# Patient Record
Sex: Female | Born: 1955
Health system: Southern US, Community
[De-identification: ages and names within clinical notes are randomized; demographics above are authoritative.]

## PROBLEM LIST (undated history)

## (undated) DIAGNOSIS — M1990 Unspecified osteoarthritis, unspecified site: Secondary | ICD-10-CM

## (undated) DIAGNOSIS — C801 Malignant (primary) neoplasm, unspecified: Secondary | ICD-10-CM

## (undated) DIAGNOSIS — I10 Essential (primary) hypertension: Secondary | ICD-10-CM

## (undated) HISTORY — PX: ABDOMINAL HYSTERECTOMY: SHX81

---

## 2011-11-02 ENCOUNTER — Encounter (HOSPITAL_BASED_OUTPATIENT_CLINIC_OR_DEPARTMENT_OTHER): Payer: Self-pay | Admitting: Emergency Medicine

## 2011-11-02 ENCOUNTER — Emergency Department (HOSPITAL_BASED_OUTPATIENT_CLINIC_OR_DEPARTMENT_OTHER)
Admission: EM | Admit: 2011-11-02 | Discharge: 2011-11-02 | Disposition: A | Discharge status: Home / Self Care | Payer: Self-pay | Attending: Emergency Medicine | Admitting: Emergency Medicine

## 2011-11-02 ENCOUNTER — Emergency Department (HOSPITAL_BASED_OUTPATIENT_CLINIC_OR_DEPARTMENT_OTHER): Payer: Self-pay

## 2011-11-02 DIAGNOSIS — J40 Bronchitis, not specified as acute or chronic: Secondary | ICD-10-CM | POA: Insufficient documentation

## 2011-11-02 DIAGNOSIS — Z8739 Personal history of other diseases of the musculoskeletal system and connective tissue: Secondary | ICD-10-CM | POA: Insufficient documentation

## 2011-11-02 DIAGNOSIS — F17200 Nicotine dependence, unspecified, uncomplicated: Secondary | ICD-10-CM | POA: Insufficient documentation

## 2011-11-02 HISTORY — DX: Unspecified osteoarthritis, unspecified site: M19.90

## 2011-11-02 MED ORDER — ALBUTEROL SULFATE (5 MG/ML) 0.5% IN NEBU
5.0000 mg | INHALATION_SOLUTION | Freq: Once | RESPIRATORY_TRACT | Status: AC
  Administered 2011-11-02: 5 mg via RESPIRATORY_TRACT
  Filled 2011-11-02: qty 1

## 2011-11-02 MED ORDER — ALBUTEROL SULFATE HFA 108 (90 BASE) MCG/ACT IN AERS
2.0000 | INHALATION_SPRAY | RESPIRATORY_TRACT | Status: DC | PRN
  Administered 2011-11-02 (×2): 2 via RESPIRATORY_TRACT
  Filled 2011-11-02: qty 6.7

## 2011-11-02 MED ORDER — IPRATROPIUM BROMIDE 0.02 % IN SOLN
0.5000 mg | Freq: Once | RESPIRATORY_TRACT | Status: AC
  Administered 2011-11-02: 0.5 mg via RESPIRATORY_TRACT
  Filled 2011-11-02: qty 2.5

## 2011-11-02 MED ORDER — FLUTICASONE PROPIONATE HFA 44 MCG/ACT IN AERO
2.0000 | INHALATION_SPRAY | Freq: Two times a day (BID) | RESPIRATORY_TRACT | Status: DC
  Administered 2011-11-02: 2 via RESPIRATORY_TRACT
  Filled 2011-11-02: qty 10.6

## 2011-11-02 NOTE — ED Notes (Signed)
Pt reports waking up Friday night with sob, reports taking aleve cold and congestion for cough, reports feeling tightness in  Chest since Friday night, pressure mid sternum

## 2011-11-02 NOTE — ED Provider Notes (Addendum)
History  This chart was scribed for Hanley Seamen, MD by Bennett Scrape. This patient was seen in room MH09/MH09 and the patient's care was started at 12:26AM.  CSN: [id number]  Arrival date & time 11/02/11  0001   First MD Initiated Contact with Patient 11/02/11 0026      Chief Complaint  Patient presents with  . Shortness of Breath     The history is provided by the patient. No language interpreter was used.    Rachael Lester is a 56 y.o. female who presents to the Emergency Department complaining of 4 days of gradual onset, gradually worsening, constant SOB with associated non-productive cough, wheezing, mild abdominal pain described as soreness, mild mid back pain described as soreness and mid sternum CP described as pressure and tightness. The pains are worse with coughing and the cough is worse with deep breathing. She reports taking Aleve cold and congestion for cough with no improvement. She reports prior episode several years ago diagnosed as PNA. She denies fever, sore throat, visual disturbance, nausea, emesis, diarrhea, urinary symptoms, HA, and rash as associated symptoms. She has a h/io arthritis and is a current everyday smoker and occasional alcohol user.   Past Medical History  Diagnosis Date  . Arthritis     History reviewed. No pertinent past surgical history.  History reviewed. No pertinent family history.  History  Substance Use Topics  . Smoking status: Current Everyday Smoker -- 0.5 packs/day  . Smokeless tobacco: Not on file  . Alcohol Use: Yes     occasional    No OB history provided.  Review of Systems  A complete 10 system review of systems was obtained and all systems are negative except as noted in the HPI and PMH.   Allergies  Review of patient's allergies indicates no known allergies.  Home Medications  No current outpatient prescriptions on file.  Triage Vitals: BP 181/104  Pulse 92  Temp 97.3 F (36.3 C)  Resp 20  Ht 5\' 6"   (1.676 m)  Wt 181 lb (82.101 kg)  BMI 29.21 kg/m2  SpO2 93%  Physical Exam  Nursing note and vitals reviewed. Constitutional: She is oriented to person, place, and time. She appears well-developed and well-nourished. No distress.  HENT:  Head: Normocephalic and atraumatic.  Eyes: Conjunctivae and EOM are normal.  Neck: Neck supple. No tracheal deviation present.  Cardiovascular: Normal rate and regular rhythm.   No murmur heard. Pulmonary/Chest: Effort normal. No respiratory distress. She has no wheezes. She has no rales. She exhibits no tenderness.       Mildly decreased air movement  Abdominal: Soft. Bowel sounds are normal. There is tenderness (mild diffuse tenderness of the abdomen). There is no rebound and no guarding.  Musculoskeletal: Normal range of motion. She exhibits no tenderness.       No spine tenderness  Neurological: She is alert and oriented to person, place, and time.  Skin: Skin is warm and dry.  Psychiatric: She has a normal mood and affect. Her behavior is normal.    ED Course  Procedures (including critical care time)  DIAGNOSTIC STUDIES: Oxygen Saturation is 93% on room air, adequate by my interpretation.    COORDINATION OF CARE: 12:48AM-Informed pt that her EKG was normal. Discussed treatment plan which includes a breathing treatment and a CXR with pt at bedside and pt agreed to plan.    MDM  Nursing notes and vitals signs, including pulse oximetry, reviewed.  Summary of this visit's results, reviewed  by myself:  Imaging Studies: Dg Chest 2 View  11/02/2011  *RADIOLOGY REPORT*  Clinical Data: Short of breath  CHEST - 2 VIEW  Comparison: None.  Findings: Heart size upper normal.  Negative for heart failure. Negative for pneumonia or effusion.  Negative for mass lesion.  IMPRESSION: No acute abnormality.   Original Report Authenticated By: Camelia Phenes, M.D.    1:42 AM Patient feels better, air movement improved after albuterol and Atrovent neb  treatment. Patient advised that this may represent the early stages of COPD and she was encouraged to quit smoking.   Date: 11/02/2011 12:24 AM  Rate: 87  Rhythm: normal sinus rhythm  QRS Axis: normal  Intervals: normal  ST/T Wave abnormalities: normal  Conduction Disutrbances: none  Narrative Interpretation: unremarkable  Comparison with previous EKG: none available          I personally performed the services described in this documentation, which was scribed in my presence.  The recorded information has been reviewed and considered.    Hanley Seamen, MD 11/02/11 0143  Hanley Seamen, MD 11/02/11 9811

## 2011-11-02 NOTE — ED Notes (Signed)
Pt has a hx of smoking but has high BP and states that she has no respiratory hx. Pt states that she smokes everyday and presented to the ED with a cold.

## 2016-09-20 ENCOUNTER — Ambulatory Visit: Payer: Medicaid Other

## 2016-09-20 ENCOUNTER — Ambulatory Visit (HOSPITAL_BASED_OUTPATIENT_CLINIC_OR_DEPARTMENT_OTHER): Payer: Medicaid Other | Admitting: Hematology & Oncology

## 2016-09-20 ENCOUNTER — Ambulatory Visit: Payer: PRIVATE HEALTH INSURANCE

## 2016-09-20 ENCOUNTER — Other Ambulatory Visit (HOSPITAL_BASED_OUTPATIENT_CLINIC_OR_DEPARTMENT_OTHER): Payer: Medicaid Other

## 2016-09-20 VITALS — BP 135/92 | HR 101 | Temp 98.4°F | Resp 16 | Ht 66.0 in | Wt 145.0 lb

## 2016-09-20 DIAGNOSIS — C342 Malignant neoplasm of middle lobe, bronchus or lung: Secondary | ICD-10-CM

## 2016-09-20 DIAGNOSIS — F17218 Nicotine dependence, cigarettes, with other nicotine-induced disorders: Secondary | ICD-10-CM

## 2016-09-20 DIAGNOSIS — Z1231 Encounter for screening mammogram for malignant neoplasm of breast: Secondary | ICD-10-CM

## 2016-09-20 DIAGNOSIS — Z923 Personal history of irradiation: Secondary | ICD-10-CM

## 2016-09-20 DIAGNOSIS — F32A Depression, unspecified: Secondary | ICD-10-CM

## 2016-09-20 DIAGNOSIS — F329 Major depressive disorder, single episode, unspecified: Secondary | ICD-10-CM

## 2016-09-20 DIAGNOSIS — C3431 Malignant neoplasm of lower lobe, right bronchus or lung: Secondary | ICD-10-CM

## 2016-09-20 DIAGNOSIS — I10 Essential (primary) hypertension: Secondary | ICD-10-CM

## 2016-09-20 DIAGNOSIS — F064 Anxiety disorder due to known physiological condition: Secondary | ICD-10-CM

## 2016-09-20 DIAGNOSIS — Z9221 Personal history of antineoplastic chemotherapy: Secondary | ICD-10-CM

## 2016-09-20 LAB — CBC WITH DIFFERENTIAL (CANCER CENTER ONLY)
BASO#: 0 10*3/uL (ref 0.0–0.2)
BASO%: 0.5 % (ref 0.0–2.0)
EOS%: 2.2 % (ref 0.0–7.0)
Eosinophils Absolute: 0.1 10*3/uL (ref 0.0–0.5)
HCT: 35 % (ref 34.8–46.6)
HGB: 11.8 g/dL (ref 11.6–15.9)
LYMPH#: 1 10*3/uL (ref 0.9–3.3)
LYMPH%: 23.4 % (ref 14.0–48.0)
MCH: 29.5 pg (ref 26.0–34.0)
MCHC: 33.7 g/dL (ref 32.0–36.0)
MCV: 88 fL (ref 81–101)
MONO#: 0.5 10*3/uL (ref 0.1–0.9)
MONO%: 11.1 % (ref 0.0–13.0)
NEUT#: 2.6 10*3/uL (ref 1.5–6.5)
NEUT%: 62.8 % (ref 39.6–80.0)
PLATELETS: 169 10*3/uL (ref 145–400)
RBC: 4 10*6/uL (ref 3.70–5.32)
RDW: 13.7 % (ref 11.1–15.7)
WBC: 4.1 10*3/uL (ref 3.9–10.0)

## 2016-09-20 LAB — CMP (CANCER CENTER ONLY)
ALT: 15 U/L (ref 10–47)
AST: 22 U/L (ref 11–38)
Albumin: 3.6 g/dL (ref 3.3–5.5)
Alkaline Phosphatase: 65 U/L (ref 26–84)
BUN: 14 mg/dL (ref 7–22)
CO2: 29 mEq/L (ref 18–33)
CREATININE: 1.4 mg/dL — AB (ref 0.6–1.2)
Calcium: 10.1 mg/dL (ref 8.0–10.3)
Chloride: 105 mEq/L (ref 98–108)
GLUCOSE: 73 mg/dL (ref 73–118)
POTASSIUM: 4 meq/L (ref 3.3–4.7)
SODIUM: 139 meq/L (ref 128–145)
Total Bilirubin: 0.7 mg/dl (ref 0.20–1.60)
Total Protein: 8.1 g/dL (ref 6.4–8.1)

## 2016-09-20 MED ORDER — PANTOPRAZOLE SODIUM 40 MG PO TBEC: 40.0000 mg | DELAYED_RELEASE_TABLET | Freq: Every day | ORAL | 6 refills | Status: AC

## 2016-09-20 MED ORDER — HEPARIN SOD (PORK) LOCK FLUSH 100 UNIT/ML IV SOLN
500.0000 [IU] | Freq: Once | INTRAVENOUS | Status: AC
  Administered 2016-09-20: 500 [IU] via INTRAVENOUS
  Filled 2016-09-20: qty 5

## 2016-09-20 MED ORDER — ALPRAZOLAM 0.25 MG PO TABS: 0.2500 mg | ORAL_TABLET | Freq: Two times a day (BID) | ORAL | 0 refills | Status: DC | PRN

## 2016-09-20 MED ORDER — CARVEDILOL 3.125 MG PO TABS: 3.1250 mg | ORAL_TABLET | Freq: Two times a day (BID) | ORAL | 4 refills | Status: DC

## 2016-09-20 MED ORDER — SODIUM CHLORIDE 0.9% FLUSH
10.0000 mL | INTRAVENOUS | Status: DC | PRN
  Administered 2016-09-20: 10 mL via INTRAVENOUS
  Filled 2016-09-20: qty 10

## 2016-09-20 MED ORDER — CARVEDILOL 6.25 MG PO TABS: 6.2500 mg | ORAL_TABLET | Freq: Two times a day (BID) | ORAL | 4 refills | Status: DC

## 2016-09-20 NOTE — Patient Instructions (Signed)
Implanted Port Insertion, Care After °This sheet gives you information about how to care for yourself after your procedure. Your health care provider may also give you more specific instructions. If you have problems or questions, contact your health care provider. °What can I expect after the procedure? °After your procedure, it is common to have: °· Discomfort at the port insertion site. °· Bruising on the skin over the port. This should improve over 3-4 days. ° °Follow these instructions at home: °Port care °· After your port is placed, you will get a manufacturer's information card. The card has information about your port. Keep this card with you at all times. °· Take care of the port as told by your health care provider. Ask your health care provider if you or a family member can get training for taking care of the port at home. A home health care nurse may also take care of the port. °· Make sure to remember what type of port you have. °Incision care °· Follow instructions from your health care provider about how to take care of your port insertion site. Make sure you: °? Wash your hands with soap and water before you change your bandage (dressing). If soap and water are not available, use hand sanitizer. °? Change your dressing as told by your health care provider. °? Leave stitches (sutures), skin glue, or adhesive strips in place. These skin closures may need to stay in place for 2 weeks or longer. If adhesive strip edges start to loosen and curl up, you may trim the loose edges. Do not remove adhesive strips completely unless your health care provider tells you to do that. °· Check your port insertion site every day for signs of infection. Check for: °? More redness, swelling, or pain. °? More fluid or blood. °? Warmth. °? Pus or a bad smell. °General instructions °· Do not take baths, swim, or use a hot tub until your health care provider approves. °· Do not lift anything that is heavier than 10 lb (4.5  kg) for a week, or as told by your health care provider. °· Ask your health care provider when it is okay to: °? Return to work or school. °? Resume usual physical activities or sports. °· Do not drive for 24 hours if you were given a medicine to help you relax (sedative). °· Take over-the-counter and prescription medicines only as told by your health care provider. °· Wear a medical alert bracelet in case of an emergency. This will tell any health care providers that you have a port. °· Keep all follow-up visits as told by your health care provider. This is important. °Contact a health care provider if: °· You cannot flush your port with saline as directed, or you cannot draw blood from the port. °· You have a fever or chills. °· You have more redness, swelling, or pain around your port insertion site. °· You have more fluid or blood coming from your port insertion site. °· Your port insertion site feels warm to the touch. °· You have pus or a bad smell coming from the port insertion site. °Get help right away if: °· You have chest pain or shortness of breath. °· You have bleeding from your port that you cannot control. °Summary °· Take care of the port as told by your health care provider. °· Change your dressing as told by your health care provider. °· Keep all follow-up visits as told by your health care provider. °  This information is not intended to replace advice given to you by your health care provider. Make sure you discuss any questions you have with your health care provider. °Document Released: 12/20/2012 Document Revised: 01/21/2016 Document Reviewed: 01/21/2016 °Elsevier Interactive Patient Education © 2017 [employer] ° °

## 2016-09-20 NOTE — Progress Notes (Signed)
Referral MD  Reason for Referral: Limited stage small cell lung cancer   Chief Complaint  Patient presents with  . New Patient (Initial Visit)  : I just moved into town and need a cancer doctor.  HPI: Rachael Lester is a very charming 61 year old African-American female. She is from [place]. 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 In October, she presented to the emergency room with chest pain. She was found have a large mass in the right lower lobe. She was admitted to a local hospital. She was seen by pulmonary medicine. She had a bronchoscopy on October 16. The pathology report ([id number]) showed small cell lung cancer.  She was seen by Dr. Daryll Brod. She went ahead and treated Rachael Lester with combination chemotherapy and radiation therapy. She received cis-platinum/etoposide. She had 4 cycles of treatment. She had concurrent radiation therapy. This all finished up I think in January. She had a complete response.  Follow-up CT scan showed marked response to treatment with resolution of right lower lobe and hilar/mediastinal lymph nodes.  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 She required a PEG tube replaced. This was ultimately taken out in April or May.  She did have a Port-A-Cath placed. This is not been flushed since April.  Her daughter is having health issues. As such, she had to move to [place] to help out.  She looks great. She feels good. Unfortunately, she is still smoking. We really have to see about getting her into some smoking cessation program.  She's had no headache.  I forget to mention that she did receive PCI. This was completed in March 2018.  She thinks her last scans were done in April. She says these all turned out negative.  She has had no problem bowels or bladder. She's had no leg swelling. She's had no rashes. She's had no dysphagia or odynophagia.  Overall, I believe that her performance status is ECOG 1.    Past  Medical History:  Diagnosis Date  . Arthritis   :  No past surgical history on file.:   Current Outpatient Prescriptions:  .  aspirin EC 81 MG tablet, Take 81 mg by mouth daily., Disp: , Rfl:  .  carvedilol (COREG) 3.125 MG tablet, Take 1 tablet (3.125 mg total) by mouth 2 (two) times daily with a meal., Disp: 60 tablet, Rfl: 4 .  ALPRAZolam (XANAX) 0.25 MG tablet, Take 1 tablet (0.25 mg total) by mouth 2 (two) times daily as needed for anxiety., Disp: 60 tablet, Rfl: 0 .  pantoprazole (PROTONIX) 40 MG tablet, Take 1 tablet (40 mg total) by mouth daily., Disp: 30 tablet, Rfl: 6 No current facility-administered medications for this visit.   Facility-Administered Medications Ordered in Other Visits:  .  sodium chloride flush (NS) 0.9 % injection 10 mL, 10 mL, Intravenous, PRN, Rachael Napoleon, MD, 10 mL at 09/20/16 1626:  :  Allergies  Allergen Reactions  . Delsym [Dextromethorphan Polistirex Er] Hives and Itching  :  No family history on file.:  Social History   Social History  . Marital status: Single    Spouse name: N/A  . Number of children: N/A  . Years of education: N/A   Occupational History  . Not on file.   Social History Main Topics  . Smoking status: Current Every Day Smoker    Packs/day: 0.50  . Smokeless tobacco: Not on file  . Alcohol use Yes     Comment: occasional  . Drug  use: Unknown  . Sexual activity: Not on file   Other Topics Concern  . Not on file   Social History Narrative  . No narrative on file  :  Pertinent items are noted in HPI.  Exam:Well-developed well-nourished African-American female in no obvious distress. Vital signs are temperature of 98.4. Pulse 101. Blood pressure 135/92. Weight is 145 pounds. Head and neck exam shows no ocular or oral lesions. She has no scleral icterus. There are no palpable cervical or supraclavicular lymph nodes. Lungs are clear bilaterally. Cardiac exam regular rate and rhythm with no murmurs, rubs or  bruits. Abdomen is soft. She has good bowel sounds. There is no fluid wave. She has the healed PEG site in the left upper quadrant. There is no palpable liver or spleen tip. Back exam shows no tenderness over the spine, ribs or hips. Extremities shows no clubbing, cyanosis or edema. She has good range of motion of her joints. Neurological exam shows no focal neurological deficits. Skin exam shows no rashes, ecchymoses or petechia. Recent Labs  09/20/16 1401  NA 139  K 4.0  CL 105  CO2 29  GLUCOSE 73  BUN 14  CREATININE 1.4*  CALCIUM 10.1    Blood smear review: None  Pathology: As above     Assessment and Plan: Ms. Lester is a very nice 61 year old Afro-American female. She had limited stage small cell lung cancer. She was treated with combination chemotherapy and radiation therapy. She has cis-platinum/etoposide.  She then had PCI.  At this point, I believe we have to do some baseline scans on her. I want to see how things look. The family that she is still smoking increases her risk of recurrence significantly. I talked to her about this. She is willing to try smoking cessation. Will have to see there is some smoking cessation class that she can get involved with.  She also is having a mammogram done.  Of course, she does not have a family doctor yet. She has run out of all of her medications. We will have to reorder these so that she does not have a stroke from high blood pressure. Prevalite told her to take baby aspirin with a dose of 81 mg daily. She can buy this over-the-counter.  I will get her scans set up in the next 2 or 3 weeks.  I will like to see her back in one month.  I spent about 45 minutes with her. She is very nice. She is very intelligent and well spoken.  I gave her a prayer blanket which she very much appreciated.

## 2016-09-21 ENCOUNTER — Other Ambulatory Visit: Payer: Self-pay | Admitting: *Deleted

## 2016-09-21 DIAGNOSIS — C3490 Malignant neoplasm of unspecified part of unspecified bronchus or lung: Secondary | ICD-10-CM

## 2016-09-21 LAB — LACTATE DEHYDROGENASE: LDH: 121 U/L — AB (ref 125–245)

## 2016-09-30 ENCOUNTER — Ambulatory Visit (HOSPITAL_BASED_OUTPATIENT_CLINIC_OR_DEPARTMENT_OTHER)
Admission: RE | Admit: 2016-09-30 | Discharge: 2016-09-30 | Disposition: A | Discharge status: Home / Self Care | Payer: Medicaid Other | Source: Ambulatory Visit | Attending: Hematology & Oncology | Admitting: Hematology & Oncology

## 2016-09-30 ENCOUNTER — Ambulatory Visit (HOSPITAL_BASED_OUTPATIENT_CLINIC_OR_DEPARTMENT_OTHER): Payer: PRIVATE HEALTH INSURANCE

## 2016-09-30 ENCOUNTER — Other Ambulatory Visit (HOSPITAL_BASED_OUTPATIENT_CLINIC_OR_DEPARTMENT_OTHER): Payer: PRIVATE HEALTH INSURANCE

## 2016-09-30 ENCOUNTER — Encounter (HOSPITAL_BASED_OUTPATIENT_CLINIC_OR_DEPARTMENT_OTHER): Payer: Self-pay

## 2016-09-30 DIAGNOSIS — Z1231 Encounter for screening mammogram for malignant neoplasm of breast: Secondary | ICD-10-CM

## 2016-09-30 DIAGNOSIS — C342 Malignant neoplasm of middle lobe, bronchus or lung: Secondary | ICD-10-CM

## 2016-09-30 DIAGNOSIS — F064 Anxiety disorder due to known physiological condition: Secondary | ICD-10-CM

## 2016-09-30 MED FILL — PANTOPRAZOLE SOD DR 40 MG T: 40 | 30 days supply | Qty: 30 | Fill #0 | Status: TO

## 2016-09-30 MED FILL — ALPRAZolam 0.25 MG TABS: 0.25 | 30 days supply | Qty: 60 | Fill #0

## 2016-09-30 MED FILL — CARVEDILOL 3.125 MG TABLET: 3.125 | 30 days supply | Qty: 60 | Fill #0 | Status: TO

## 2016-10-07 ENCOUNTER — Other Ambulatory Visit: Payer: Medicaid Other

## 2016-10-07 ENCOUNTER — Ambulatory Visit (HOSPITAL_BASED_OUTPATIENT_CLINIC_OR_DEPARTMENT_OTHER)
Admission: RE | Admit: 2016-10-07 | Discharge: 2016-10-07 | Disposition: A | Discharge status: Home / Self Care | Payer: Medicaid Other | Source: Ambulatory Visit | Attending: Hematology & Oncology | Admitting: Hematology & Oncology

## 2016-10-07 ENCOUNTER — Encounter (HOSPITAL_BASED_OUTPATIENT_CLINIC_OR_DEPARTMENT_OTHER): Payer: Self-pay

## 2016-10-07 ENCOUNTER — Other Ambulatory Visit: Payer: Self-pay | Admitting: *Deleted

## 2016-10-07 DIAGNOSIS — J90 Pleural effusion, not elsewhere classified: Secondary | ICD-10-CM | POA: Diagnosis not present

## 2016-10-07 DIAGNOSIS — C342 Malignant neoplasm of middle lobe, bronchus or lung: Secondary | ICD-10-CM

## 2016-10-07 DIAGNOSIS — Z1231 Encounter for screening mammogram for malignant neoplasm of breast: Secondary | ICD-10-CM | POA: Insufficient documentation

## 2016-10-07 DIAGNOSIS — I700 Atherosclerosis of aorta: Secondary | ICD-10-CM | POA: Diagnosis not present

## 2016-10-07 DIAGNOSIS — F064 Anxiety disorder due to known physiological condition: Secondary | ICD-10-CM

## 2016-10-07 DIAGNOSIS — I313 Pericardial effusion (noninflammatory): Secondary | ICD-10-CM | POA: Insufficient documentation

## 2016-10-07 DIAGNOSIS — J439 Emphysema, unspecified: Secondary | ICD-10-CM | POA: Diagnosis not present

## 2016-10-07 HISTORY — DX: Essential (primary) hypertension: I10

## 2016-10-07 HISTORY — DX: Malignant (primary) neoplasm, unspecified: C80.1

## 2016-10-07 MED ORDER — OXYCODONE HCL 5 MG PO TABS: 5.0000 mg | ORAL_TABLET | ORAL | 0 refills | Status: DC | PRN

## 2016-10-07 MED ORDER — IOPAMIDOL (ISOVUE-300) INJECTION 61%
100.0000 mL | Freq: Once | INTRAVENOUS | Status: AC | PRN
  Administered 2016-10-07: 100 mL via INTRAVENOUS

## 2016-10-08 ENCOUNTER — Telehealth: Payer: Self-pay | Admitting: *Deleted

## 2016-10-08 NOTE — Telephone Encounter (Addendum)
Patient is aware of results  ----- Message from Volanda Napoleon, MD sent at 10/07/2016  5:39 PM EDT ----- Please call and let her know of the brain scan looks okay. pete

## 2016-10-21 ENCOUNTER — Other Ambulatory Visit (HOSPITAL_BASED_OUTPATIENT_CLINIC_OR_DEPARTMENT_OTHER): Payer: Medicaid Other

## 2016-10-21 ENCOUNTER — Ambulatory Visit: Payer: Medicaid Other | Admitting: Hematology & Oncology

## 2016-10-21 VITALS — BP 134/99 | HR 82 | Temp 98.4°F | Resp 16 | Wt 149.0 lb

## 2016-10-21 DIAGNOSIS — F17218 Nicotine dependence, cigarettes, with other nicotine-induced disorders: Secondary | ICD-10-CM | POA: Diagnosis not present

## 2016-10-21 DIAGNOSIS — Z85118 Personal history of other malignant neoplasm of bronchus and lung: Secondary | ICD-10-CM

## 2016-10-21 DIAGNOSIS — C342 Malignant neoplasm of middle lobe, bronchus or lung: Secondary | ICD-10-CM

## 2016-10-21 DIAGNOSIS — Z9221 Personal history of antineoplastic chemotherapy: Secondary | ICD-10-CM

## 2016-10-21 DIAGNOSIS — Z923 Personal history of irradiation: Secondary | ICD-10-CM

## 2016-10-21 DIAGNOSIS — Z1231 Encounter for screening mammogram for malignant neoplasm of breast: Secondary | ICD-10-CM

## 2016-10-21 DIAGNOSIS — Z716 Tobacco abuse counseling: Secondary | ICD-10-CM

## 2016-10-21 DIAGNOSIS — C3491 Malignant neoplasm of unspecified part of right bronchus or lung: Secondary | ICD-10-CM

## 2016-10-21 DIAGNOSIS — F064 Anxiety disorder due to known physiological condition: Secondary | ICD-10-CM

## 2016-10-21 LAB — CMP (CANCER CENTER ONLY)
ALBUMIN: 3.5 g/dL (ref 3.3–5.5)
ALK PHOS: 82 U/L (ref 26–84)
ALT: 16 U/L (ref 10–47)
AST: 20 U/L (ref 11–38)
BILIRUBIN TOTAL: 0.6 mg/dL (ref 0.20–1.60)
BUN, Bld: 14 mg/dL (ref 7–22)
CALCIUM: 9.9 mg/dL (ref 8.0–10.3)
CO2: 31 mEq/L (ref 18–33)
Chloride: 101 mEq/L (ref 98–108)
Creat: 1.4 mg/dl — ABNORMAL HIGH (ref 0.6–1.2)
Glucose, Bld: 106 mg/dL (ref 73–118)
Potassium: 4 mEq/L (ref 3.3–4.7)
Sodium: 140 mEq/L (ref 128–145)
Total Protein: 7.5 g/dL (ref 6.4–8.1)

## 2016-10-21 LAB — CBC WITH DIFFERENTIAL (CANCER CENTER ONLY)
BASO#: 0 10*3/uL (ref 0.0–0.2)
BASO%: 0.2 % (ref 0.0–2.0)
EOS%: 2.9 % (ref 0.0–7.0)
Eosinophils Absolute: 0.1 10*3/uL (ref 0.0–0.5)
HEMATOCRIT: 33 % — AB (ref 34.8–46.6)
HEMOGLOBIN: 11.1 g/dL — AB (ref 11.6–15.9)
LYMPH#: 0.9 10*3/uL (ref 0.9–3.3)
LYMPH%: 17.9 % (ref 14.0–48.0)
MCH: 29.2 pg (ref 26.0–34.0)
MCHC: 33.6 g/dL (ref 32.0–36.0)
MCV: 87 fL (ref 81–101)
MONO#: 0.5 10*3/uL (ref 0.1–0.9)
MONO%: 10.8 % (ref 0.0–13.0)
NEUT%: 68.2 % (ref 39.6–80.0)
NEUTROS ABS: 3.4 10*3/uL (ref 1.5–6.5)
Platelets: 184 10*3/uL (ref 145–400)
RBC: 3.8 10*6/uL (ref 3.70–5.32)
RDW: 14.4 % (ref 11.1–15.7)
WBC: 4.9 10*3/uL (ref 3.9–10.0)

## 2016-10-21 LAB — LACTATE DEHYDROGENASE: LDH: 111 U/L — ABNORMAL LOW (ref 125–245)

## 2016-10-21 MED ORDER — VARENICLINE TARTRATE 0.5 MG X 11 & 1 MG X 42 PO MISC: ORAL | 0 refills | Status: DC

## 2016-10-21 MED FILL — oxyCODONE HCL 5 MG TABS: 5 | 10 days supply | Qty: 60 | Fill #0

## 2016-10-21 MED FILL — CHANTIX STARTING MONTH BOX: 0.5 MG X 11 | 28 days supply | Qty: 53 | Fill #0

## 2016-10-21 NOTE — Progress Notes (Signed)
Hematology and Oncology Follow Up Visit  Rachael Lester [id number] Aug 29, 1955 61 y.o. 10/21/2016   Principle Diagnosis:   Limited stage small cell lung cancer of the right lung  Current Therapy:    Status post chemotherapy and radiation therapy in [place].      Completed in January 2018     Interim History:  Rachael Lester is back for follow-up. This is her second office visit. We first saw her about a month ago.  She had a CT scan done. This was done on July 26. 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 It measured 4.5 x 1.7 cm. Also noted was a area of subpleural nodularity in the right lower lobe measuring 1.4 x 1 cm. She had some groundglass opacity in the right middle lobe. Some inflammatory changes were noted over on the left side. She had some emphysematous changes. She had a 7 x 5 mm hypodense lesion in the liver area and it was felt that this was present since 2009. Everything else looked okay.   The brain scan looked okay.  She is still smoking. She really wants to stop smoking. I gave her a prescription for Chantix.  She's complain of reflux. She is on some Protonix. She also takes some Mylanta.  She's had no fever. There is no bleeding. She's had no headache. She's had no diarrhea.  There's been no leg swelling. She's had no rashes.  Overall, her performance status is ECOG 1.  Medications:  Current Outpatient Prescriptions:  .  ALPRAZolam (XANAX) 0.25 MG tablet, Take 1 tablet (0.25 mg total) by mouth 2 (two) times daily as needed for anxiety., Disp: 60 tablet, Rfl: 0 .  aspirin EC 81 MG tablet, Take 81 mg by mouth daily., Disp: , Rfl:  .  carvedilol (COREG) 6.25 MG tablet, , Disp: , Rfl: 4 .  oxyCODONE (OXY IR/ROXICODONE) 5 MG immediate release tablet, Take 1 tablet (5 mg total) by mouth every 4 (four) hours as needed for severe pain., Disp: 60 tablet, Rfl: 0 .  pantoprazole (PROTONIX) 40 MG tablet, Take 1 tablet (40 mg total) by mouth  daily., Disp: 30 tablet, Rfl: 6 .  varenicline (CHANTIX STARTING MONTH PAK) 0.5 MG X 11 & 1 MG X 42 tablet, Take one 0.5 mg tablet by mouth once daily for 3 days, then increase to one 0.5 mg tablet twice daily for 4 days, then increase to one 1 mg tablet twice daily., Disp: 53 tablet, Rfl: 0  Allergies:  Allergies  Allergen Reactions  . Delsym [Dextromethorphan Polistirex Er] Hives and Itching    Past Medical History, Surgical history, Social history, and Family History were reviewed and updated.  Review of Systems:  As above  Physical Exam:  weight is 149 lb (67.6 kg). Her oral temperature is 98.4 F (36.9 C). Her blood pressure is 134/99 (abnormal) and her pulse is 82. Her respiration is 16 and oxygen saturation is 99%.   Wt Readings from Last 3 Encounters:  10/21/16 149 lb (67.6 kg)  09/20/16 145 lb (65.8 kg)  11/02/11 181 lb (82.1 kg)     Head and neck exam shows no ocular or oral lesions. She has no scleral icterus. There are no palpable cervical or supraclavicular lymph nodes. Lungs are clear bilaterally. Cardiac exam regular rate and rhythm with no murmurs, rubs or bruits. Abdomen is soft. She has good bowel sounds. There is no fluid wave. She has the healed PEG site in the left upper quadrant.  There is no palpable liver or spleen tip. Back exam shows no tenderness over the spine, ribs or hips. Extremities shows no clubbing, cyanosis or edema. She has good range of motion of her joints. Neurological exam shows no focal neurological deficits. Skin exam shows no rashes, ecchymoses or petechia.  Lab Results  Component Value Date   WBC 4.9 10/21/2016   HGB 11.1 (L) 10/21/2016   HCT 33.0 (L) 10/21/2016   MCV 87 10/21/2016   PLT 184 10/21/2016     Chemistry      Component Value Date/Time   NA 140 10/21/2016 0856   K 4.0 10/21/2016 0856   CL 101 10/21/2016 0856   CO2 31 10/21/2016 0856   BUN 14 10/21/2016 0856   CREATININE 1.4 (H) 10/21/2016 0856      Component Value  Date/Time   CALCIUM 9.9 10/21/2016 0856   ALKPHOS 82 10/21/2016 0856   AST 20 10/21/2016 0856   ALT 16 10/21/2016 0856   BILITOT 0.60 10/21/2016 0856         Impression and Plan: Rachael Lester is a 61 year old African-American female. She had a history of limited stage small cell lung cancer. She was treated quite aggressively. I'll have to say that she is in remission.  I think the changes that we are seeing on the CT scan reflect radiation pneumonitis.  I will want to repeat her CT scan in another 6 weeks or so so we can follow-up with these changes over on the right lung.  Her smoking clearly is a huge problem. Hopefully, with the Chantix, she will slow down and stop smoking.  For right now, I will see her back in one month.  She has a very strong faith.   Rachael Napoleon, MD 8/9/201810:05 AM

## 2016-10-28 ENCOUNTER — Ambulatory Visit: Payer: Self-pay | Admitting: Family Medicine

## 2016-10-28 DIAGNOSIS — Z0289 Encounter for other administrative examinations: Secondary | ICD-10-CM

## 2016-11-18 ENCOUNTER — Other Ambulatory Visit (HOSPITAL_BASED_OUTPATIENT_CLINIC_OR_DEPARTMENT_OTHER): Payer: Medicaid Other

## 2016-11-18 ENCOUNTER — Ambulatory Visit: Payer: Medicaid Other

## 2016-11-18 ENCOUNTER — Ambulatory Visit (HOSPITAL_BASED_OUTPATIENT_CLINIC_OR_DEPARTMENT_OTHER): Payer: Medicaid Other | Admitting: Hematology & Oncology

## 2016-11-18 VITALS — BP 97/78 | HR 82 | Temp 98.7°F | Wt 149.2 lb

## 2016-11-18 DIAGNOSIS — K5903 Drug induced constipation: Secondary | ICD-10-CM | POA: Diagnosis not present

## 2016-11-18 DIAGNOSIS — F17218 Nicotine dependence, cigarettes, with other nicotine-induced disorders: Secondary | ICD-10-CM

## 2016-11-18 DIAGNOSIS — Z85118 Personal history of other malignant neoplasm of bronchus and lung: Secondary | ICD-10-CM

## 2016-11-18 DIAGNOSIS — T402X5A Adverse effect of other opioids, initial encounter: Secondary | ICD-10-CM

## 2016-11-18 DIAGNOSIS — Z923 Personal history of irradiation: Secondary | ICD-10-CM

## 2016-11-18 DIAGNOSIS — C3491 Malignant neoplasm of unspecified part of right bronchus or lung: Secondary | ICD-10-CM

## 2016-11-18 DIAGNOSIS — Z716 Tobacco abuse counseling: Secondary | ICD-10-CM

## 2016-11-18 DIAGNOSIS — Z9221 Personal history of antineoplastic chemotherapy: Secondary | ICD-10-CM | POA: Diagnosis not present

## 2016-11-18 LAB — CBC WITH DIFFERENTIAL (CANCER CENTER ONLY)
BASO#: 0 10*3/uL (ref 0.0–0.2)
BASO%: 0.5 % (ref 0.0–2.0)
EOS%: 3.4 % (ref 0.0–7.0)
Eosinophils Absolute: 0.1 10*3/uL (ref 0.0–0.5)
HEMATOCRIT: 33.3 % — AB (ref 34.8–46.6)
HEMOGLOBIN: 11.3 g/dL — AB (ref 11.6–15.9)
LYMPH#: 1 10*3/uL (ref 0.9–3.3)
LYMPH%: 25 % (ref 14.0–48.0)
MCH: 29.1 pg (ref 26.0–34.0)
MCHC: 33.9 g/dL (ref 32.0–36.0)
MCV: 86 fL (ref 81–101)
MONO#: 0.6 10*3/uL (ref 0.1–0.9)
MONO%: 14.5 % — ABNORMAL HIGH (ref 0.0–13.0)
NEUT%: 56.6 % (ref 39.6–80.0)
NEUTROS ABS: 2.2 10*3/uL (ref 1.5–6.5)
Platelets: 171 10*3/uL (ref 145–400)
RBC: 3.88 10*6/uL (ref 3.70–5.32)
RDW: 14.1 % (ref 11.1–15.7)
WBC: 3.8 10*3/uL — ABNORMAL LOW (ref 3.9–10.0)

## 2016-11-18 LAB — CMP (CANCER CENTER ONLY)
ALBUMIN: 3.4 g/dL (ref 3.3–5.5)
ALT: 16 U/L (ref 10–47)
AST: 21 U/L (ref 11–38)
Alkaline Phosphatase: 67 U/L (ref 26–84)
BILIRUBIN TOTAL: 0.7 mg/dL (ref 0.20–1.60)
BUN: 14 mg/dL (ref 7–22)
CHLORIDE: 102 meq/L (ref 98–108)
CO2: 32 mEq/L (ref 18–33)
CREATININE: 1.4 mg/dL — AB (ref 0.6–1.2)
Calcium: 9.9 mg/dL (ref 8.0–10.3)
Glucose, Bld: 84 mg/dL (ref 73–118)
Potassium: 4.5 mEq/L (ref 3.3–4.7)
SODIUM: 141 meq/L (ref 128–145)
TOTAL PROTEIN: 7.2 g/dL (ref 6.4–8.1)

## 2016-11-18 MED ORDER — NALOXEGOL OXALATE 25 MG PO TABS: 25.0000 mg | ORAL_TABLET | Freq: Every day | ORAL | 2 refills | Status: DC

## 2016-11-18 MED ORDER — HEPARIN SOD (PORK) LOCK FLUSH 100 UNIT/ML IV SOLN
500.0000 [IU] | Freq: Once | INTRAVENOUS | Status: AC
  Administered 2016-11-18: 500 [IU] via INTRAVENOUS
  Filled 2016-11-18: qty 5

## 2016-11-18 MED ORDER — SODIUM CHLORIDE 0.9% FLUSH
10.0000 mL | INTRAVENOUS | Status: DC | PRN
  Administered 2016-11-18: 10 mL via INTRAVENOUS
  Filled 2016-11-18: qty 10

## 2016-11-18 NOTE — Progress Notes (Signed)
Hematology and Oncology Follow Up Visit  Rachael Lester [id number] 05-29-1955 61 y.o. 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      Completed in January 2018     Interim History:  Rachael Lester is back for follow-up.  Rachael Lester is doing okay. Rachael Lester is mostly complaining of constipation. Rachael Lester does take oxycodone. 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  I went ahead and gave her a prescription for Movantik. Rachael Lester will take 25 mg by mouth daily.  Rachael Lester is on Chantix Rachael Lester thinks that Rachael Lester is cutting back on her tobacco use. Rachael Lester is still smoking but not smoking as much.  Rachael Lester's had no headache. Rachael Lester's had no shortness of breath. Rachael Lester's had a little bit of a dry cough.   Rachael Lester's had no rashes. Her weight is holding pretty stable.   Overall, her performance status is ECOG 0.  Medications:  Current Outpatient Prescriptions:  .  ALPRAZolam (XANAX) 0.25 MG tablet, Take 1 tablet (0.25 mg total) by mouth 2 (two) times daily as needed for anxiety., Disp: 60 tablet, Rfl: 0 .  aspirin EC 81 MG tablet, Take 81 mg by mouth daily., Disp: , Rfl:  .  carvedilol (COREG) 6.25 MG tablet, , Disp: , Rfl: 4 .  oxyCODONE (OXY IR/ROXICODONE) 5 MG immediate release tablet, Take 1 tablet (5 mg total) by mouth every 4 (four) hours as needed for severe pain., Disp: 60 tablet, Rfl: 0 .  pantoprazole (PROTONIX) 40 MG tablet, Take 1 tablet (40 mg total) by mouth daily., Disp: 30 tablet, Rfl: 6 .  varenicline (CHANTIX STARTING MONTH PAK) 0.5 MG X 11 & 1 MG X 42 tablet, Take one 0.5 mg tablet by mouth once daily for 3 days, then increase to one 0.5 mg tablet twice daily for 4 days, then increase to one 1 mg tablet twice daily., Disp: 53 tablet, Rfl: 0  Allergies:  Allergies  Allergen Reactions  . Delsym [Dextromethorphan Polistirex Er] Hives and Itching    Past  Medical History, Surgical history, Social history, and Family History were reviewed and updated.  Review of Systems: As stated in the interim history Physical Exam:  weight is 149 lb 4 oz (67.7 kg). Her oral temperature is 98.7 F (37.1 C). Her blood pressure is 97/78 and her pulse is 82.   Wt Readings from Last 3 Encounters:  11/18/16 149 lb 4 oz (67.7 kg)  10/21/16 149 lb (67.6 kg)  09/20/16 145 lb (65.8 kg)     Physical Exam  Constitutional: Rachael Lester is oriented to person, place, and time.  HENT:  Head: Normocephalic and atraumatic.  Mouth/Throat: Oropharynx is clear and moist.  Eyes: Pupils are equal, round, and reactive to light. EOM are normal.  Neck: Normal range of motion.  Cardiovascular: Normal rate, regular rhythm and normal heart sounds.   Pulmonary/Chest: Effort normal and breath sounds normal.  Abdominal: Soft. Bowel sounds are normal.  Musculoskeletal: Normal range of motion. Rachael Lester exhibits no edema, tenderness or deformity.  Lymphadenopathy:    Rachael Lester has no cervical adenopathy.  Neurological: Rachael Lester is alert and oriented to person, place, and time.  Skin: Skin is warm and dry. No rash noted. No erythema.  Psychiatric: Rachael Lester has a normal mood and affect. Her behavior is normal. Judgment and thought content normal.  Vitals reviewed.    Lab Results  Component Value Date   WBC 3.8 (L) 11/18/2016   HGB 11.3 (L) 11/18/2016   HCT 33.3 (L) 11/18/2016   MCV 86 11/18/2016   PLT 171 11/18/2016     Chemistry      Component Value Date/Time   NA 141 11/18/2016 1512   K 4.5 11/18/2016 1512   CL 102 11/18/2016 1512   CO2 32 11/18/2016 1512   BUN 14 11/18/2016 1512   CREATININE 1.4 (H) 11/18/2016 1512      Component Value Date/Time   CALCIUM 9.9 11/18/2016 1512   ALKPHOS 67 11/18/2016 1512   AST 21 11/18/2016 1512   ALT 16 11/18/2016 1512   BILITOT 0.70 11/18/2016 1512         Impression and Plan: Rachael Lester is a 61 year old African-American female. Rachael Lester had a  history of limited stage small cell lung cancer. Rachael Lester was treated quite aggressively. Rachael Lester received a combination of radiation and chemotherapy in [place].  We will go ahead and flush her Port-A-Cath today.  I would like to get a follow-up CT scan was see her back in 6 weeks. I think that'll be a good time frame that we can do a another scan.  Rachael Lester is in agreement with this.  We will have the CT scan set up the same day that I see her.  I do not see anything with her labs that look suspicious. I reviewed her labs with her.  I answered all of her questions. As always, Rachael Lester is very gracious and very thankful for the wonderful care that Rachael Lester gets from our office.   Volanda Napoleon, MD 9/6/20184:03 PM

## 2016-11-18 NOTE — Patient Instructions (Signed)
Implanted Port Home Guide An implanted port is a type of central line that is placed under the skin. Central lines are used to provide IV access when treatment or nutrition needs to be given through a person's veins. Implanted ports are used for long-term IV access. An implanted port may be placed because:  You need IV medicine that would be irritating to the small veins in your hands or arms.  You need long-term IV medicines, such as antibiotics.  You need IV nutrition for a long period.  You need frequent blood draws for lab tests.  You need dialysis.  Implanted ports are usually placed in the chest area, but they can also be placed in the upper arm, the abdomen, or the leg. An implanted port has two main parts:  Reservoir. The reservoir is round and will appear as a small, raised area under your skin. The reservoir is the part where a needle is inserted to give medicines or draw blood.  Catheter. The catheter is a thin, flexible tube that extends from the reservoir. The catheter is placed into a large vein. Medicine that is inserted into the reservoir goes into the catheter and then into the vein.  How will I care for my incision site? Do not get the incision site wet. Bathe or shower as directed by your health care provider. How is my port accessed? Special steps must be taken to access the port:  Before the port is accessed, a numbing cream can be placed on the skin. This helps numb the skin over the port site.  Your health care provider uses a sterile technique to access the port. ? Your health care provider must put on a mask and sterile gloves. ? The skin over your port is cleaned carefully with an antiseptic and allowed to dry. ? The port is gently pinched between sterile gloves, and a needle is inserted into the port.  Only "non-coring" port needles should be used to access the port. Once the port is accessed, a blood return should be checked. This helps ensure that the port  is in the vein and is not clogged.  If your port needs to remain accessed for a constant infusion, a clear (transparent) bandage will be placed over the needle site. The bandage and needle will need to be changed every week, or as directed by your health care provider.  Keep the bandage covering the needle clean and dry. Do not get it wet. Follow your health care provider's instructions on how to take a shower or bath while the port is accessed.  If your port does not need to stay accessed, no bandage is needed over the port.  What is flushing? Flushing helps keep the port from getting clogged. Follow your health care provider's instructions on how and when to flush the port. Ports are usually flushed with saline solution or a medicine called heparin. The need for flushing will depend on how the port is used.  If the port is used for intermittent medicines or blood draws, the port will need to be flushed: ? After medicines have been given. ? After blood has been drawn. ? As part of routine maintenance.  If a constant infusion is running, the port may not need to be flushed.  How long will my port stay implanted? The port can stay in for as long as your health care provider thinks it is needed. When it is time for the port to come out, surgery will be   done to remove it. The procedure is similar to the one performed when the port was put in. When should I seek immediate medical care? When you have an implanted port, you should seek immediate medical care if:  You notice a bad smell coming from the incision site.  You have swelling, redness, or drainage at the incision site.  You have more swelling or pain at the port site or the surrounding area.  You have a fever that is not controlled with medicine.  This information is not intended to replace advice given to you by your health care provider. Make sure you discuss any questions you have with your health care provider. Document  Released: 03/01/2005 Document Revised: 08/07/2015 Document Reviewed: 11/06/2012 Elsevier Interactive Patient Education  2017 [employer]  

## 2016-11-23 ENCOUNTER — Other Ambulatory Visit: Payer: Self-pay | Admitting: *Deleted

## 2016-11-23 DIAGNOSIS — F064 Anxiety disorder due to known physiological condition: Secondary | ICD-10-CM

## 2016-11-23 DIAGNOSIS — C342 Malignant neoplasm of middle lobe, bronchus or lung: Secondary | ICD-10-CM

## 2016-11-23 DIAGNOSIS — Z1231 Encounter for screening mammogram for malignant neoplasm of breast: Secondary | ICD-10-CM

## 2016-11-23 MED ORDER — ALPRAZOLAM 0.25 MG PO TABS: 0.2500 mg | ORAL_TABLET | Freq: Two times a day (BID) | ORAL | 0 refills | Status: DC | PRN

## 2016-11-23 MED ORDER — OXYCODONE HCL 5 MG PO TABS: 5.0000 mg | ORAL_TABLET | ORAL | 0 refills | Status: DC | PRN

## 2016-11-24 ENCOUNTER — Other Ambulatory Visit: Payer: Self-pay | Admitting: *Deleted

## 2016-11-24 DIAGNOSIS — Z716 Tobacco abuse counseling: Secondary | ICD-10-CM

## 2016-11-24 DIAGNOSIS — C3491 Malignant neoplasm of unspecified part of right bronchus or lung: Secondary | ICD-10-CM

## 2016-11-24 MED ORDER — VARENICLINE TARTRATE 1 MG PO TABS: 1.0000 mg | ORAL_TABLET | Freq: Two times a day (BID) | ORAL | 0 refills | Status: DC

## 2016-11-24 MED FILL — oxyCODONE HCL 5 MG TABS: 5 | 10 days supply | Qty: 60 | Fill #0

## 2016-11-24 MED FILL — ALPRAZolam 0.25 MG TABS: 0.25 | 30 days supply | Qty: 60 | Fill #0

## 2016-12-27 ENCOUNTER — Other Ambulatory Visit: Payer: Self-pay | Admitting: *Deleted

## 2016-12-27 DIAGNOSIS — C342 Malignant neoplasm of middle lobe, bronchus or lung: Secondary | ICD-10-CM

## 2016-12-27 DIAGNOSIS — Z1231 Encounter for screening mammogram for malignant neoplasm of breast: Secondary | ICD-10-CM

## 2016-12-27 DIAGNOSIS — F064 Anxiety disorder due to known physiological condition: Secondary | ICD-10-CM

## 2016-12-27 MED ORDER — OXYCODONE HCL 5 MG PO TABS: 5.0000 mg | ORAL_TABLET | ORAL | 0 refills | Status: DC | PRN

## 2016-12-27 MED ORDER — ALPRAZOLAM 0.25 MG PO TABS: 0.2500 mg | ORAL_TABLET | Freq: Two times a day (BID) | ORAL | 0 refills | Status: DC | PRN

## 2017-01-05 ENCOUNTER — Other Ambulatory Visit: Payer: Self-pay | Admitting: *Deleted

## 2017-01-05 DIAGNOSIS — C3491 Malignant neoplasm of unspecified part of right bronchus or lung: Secondary | ICD-10-CM

## 2017-01-06 ENCOUNTER — Ambulatory Visit (HOSPITAL_BASED_OUTPATIENT_CLINIC_OR_DEPARTMENT_OTHER): Admission: RE | Admit: 2017-01-06 | Payer: Medicaid Other | Source: Ambulatory Visit

## 2017-01-06 ENCOUNTER — Other Ambulatory Visit (HOSPITAL_BASED_OUTPATIENT_CLINIC_OR_DEPARTMENT_OTHER): Payer: Medicaid Other

## 2017-01-06 ENCOUNTER — Ambulatory Visit (HOSPITAL_BASED_OUTPATIENT_CLINIC_OR_DEPARTMENT_OTHER): Payer: Medicaid Other

## 2017-01-06 ENCOUNTER — Ambulatory Visit: Payer: Medicaid Other | Admitting: Hematology & Oncology

## 2017-01-06 ENCOUNTER — Ambulatory Visit (HOSPITAL_BASED_OUTPATIENT_CLINIC_OR_DEPARTMENT_OTHER): Payer: Medicaid Other | Admitting: Family

## 2017-01-06 VITALS — BP 133/89 | HR 92 | Temp 98.1°F | Resp 18 | Wt 150.0 lb

## 2017-01-06 DIAGNOSIS — Z923 Personal history of irradiation: Secondary | ICD-10-CM | POA: Diagnosis not present

## 2017-01-06 DIAGNOSIS — K5900 Constipation, unspecified: Secondary | ICD-10-CM

## 2017-01-06 DIAGNOSIS — Z85118 Personal history of other malignant neoplasm of bronchus and lung: Secondary | ICD-10-CM

## 2017-01-06 DIAGNOSIS — C3491 Malignant neoplasm of unspecified part of right bronchus or lung: Secondary | ICD-10-CM

## 2017-01-06 DIAGNOSIS — Z9221 Personal history of antineoplastic chemotherapy: Secondary | ICD-10-CM

## 2017-01-06 DIAGNOSIS — Z95828 Presence of other vascular implants and grafts: Secondary | ICD-10-CM

## 2017-01-06 DIAGNOSIS — R5383 Other fatigue: Secondary | ICD-10-CM | POA: Diagnosis not present

## 2017-01-06 LAB — CBC WITH DIFFERENTIAL (CANCER CENTER ONLY)
BASO#: 0 10*3/uL (ref 0.0–0.2)
BASO%: 0.5 % (ref 0.0–2.0)
EOS ABS: 0.1 10*3/uL (ref 0.0–0.5)
EOS%: 3 % (ref 0.0–7.0)
HCT: 33.5 % — ABNORMAL LOW (ref 34.8–46.6)
HGB: 11.5 g/dL — ABNORMAL LOW (ref 11.6–15.9)
LYMPH#: 1 10*3/uL (ref 0.9–3.3)
LYMPH%: 28 % (ref 14.0–48.0)
MCH: 29.3 pg (ref 26.0–34.0)
MCHC: 34.3 g/dL (ref 32.0–36.0)
MCV: 85 fL (ref 81–101)
MONO#: 0.4 10*3/uL (ref 0.1–0.9)
MONO%: 9.7 % (ref 0.0–13.0)
NEUT#: 2.2 10*3/uL (ref 1.5–6.5)
NEUT%: 58.8 % (ref 39.6–80.0)
PLATELETS: 170 10*3/uL (ref 145–400)
RBC: 3.93 10*6/uL (ref 3.70–5.32)
RDW: 14.7 % (ref 11.1–15.7)
WBC: 3.7 10*3/uL — ABNORMAL LOW (ref 3.9–10.0)

## 2017-01-06 LAB — CMP (CANCER CENTER ONLY)
ALT(SGPT): 17 U/L (ref 10–47)
AST: 20 U/L (ref 11–38)
Albumin: 3.6 g/dL (ref 3.3–5.5)
Alkaline Phosphatase: 67 U/L (ref 26–84)
BUN: 12 mg/dL (ref 7–22)
CHLORIDE: 104 meq/L (ref 98–108)
CO2: 32 meq/L (ref 18–33)
Calcium: 10 mg/dL (ref 8.0–10.3)
Creat: 1.5 mg/dl — ABNORMAL HIGH (ref 0.6–1.2)
GLUCOSE: 92 mg/dL (ref 73–118)
POTASSIUM: 4.3 meq/L (ref 3.3–4.7)
Sodium: 143 mEq/L (ref 128–145)
Total Bilirubin: 0.5 mg/dl (ref 0.20–1.60)
Total Protein: 7.5 g/dL (ref 6.4–8.1)

## 2017-01-06 MED ORDER — SODIUM CHLORIDE 0.9% FLUSH
10.0000 mL | INTRAVENOUS | Status: DC | PRN
  Administered 2017-01-06: 10 mL via INTRAVENOUS
  Filled 2017-01-06: qty 10

## 2017-01-06 MED ORDER — HEPARIN SOD (PORK) LOCK FLUSH 100 UNIT/ML IV SOLN
500.0000 [IU] | Freq: Once | INTRAVENOUS | Status: AC
  Administered 2017-01-06: 500 [IU] via INTRAVENOUS
  Filled 2017-01-06: qty 5

## 2017-01-06 MED FILL — oxyCODONE HCL 5 MG TABS: 5 | 10 days supply | Qty: 60 | Fill #0

## 2017-01-06 MED FILL — ALPRAZolam 0.25 MG TABS: 0.25 | 30 days supply | Qty: 60 | Fill #0

## 2017-01-06 NOTE — Progress Notes (Signed)
Hematology and Oncology Follow Up Visit  Rachael Lester [id number] 1955-08-21 61 y.o. 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 She is doing fairly well. She is still having constipation despite taking Movantik. She will try adding Mirilax BID with warm prune juice and see if this helps.  She has had intermittent fatigue and weakness.  She has had an occasional sharp pain in her head that comes and goes. She has had some sinus congestion and drainage. This has caused some dizziness at times.  She is on Chantix and cutting back on her smoking. She has some SOB with over exertion and stairs. This can also cause occasional palpitations.  No fever, chills, n/v, rash, chest pain, abdominal pain or changes in bladder habits.  She has had some lower back discomfort off and on. She has a good appetite and is staying hydrated. Her weight is stable.  She had her EGD with Dr. Dallas Breeding and states that they took a biopsy but does not know the results yet. She will be seeing him again on December 7th and will schedule her colonoscopy at that time. She states that she will have their office send [place] all her results.   ECOG Performance Status: 1 - Symptomatic but completely ambulatory  Medications:  Allergies as of 01/06/2017      Reactions   Delsym [dextromethorphan Polistirex Er] Hives, Itching      Medication List       Accurate as of 01/06/17  2:28 PM. Always use your most recent med list.          ALPRAZolam 0.25 MG tablet Commonly known as:  XANAX Take 1 tablet (0.25 mg total) by mouth 2 (two) times daily as needed for anxiety.   aspirin EC 81 MG tablet Take 81 mg by mouth daily.   carvedilol 6.25 MG tablet Commonly known as:   COREG   naloxegol oxalate 25 MG Tabs tablet Commonly known as:  MOVANTIK Take 1 tablet (25 mg total) by mouth daily.   oxyCODONE 5 MG immediate release tablet Commonly known as:  Oxy IR/ROXICODONE Take 1 tablet (5 mg total) by mouth every 4 (four) hours as needed for severe pain.   pantoprazole 40 MG tablet Commonly known as:  PROTONIX Take 1 tablet (40 mg total) by mouth daily.   varenicline 1 MG tablet Commonly known as:  CHANTIX Take 1 tablet (1 mg total) by mouth 2 (two) times daily.       Allergies:  Allergies  Allergen Reactions  . Delsym [Dextromethorphan Polistirex Er] Hives and Itching    Past Medical History, Surgical history, Social history, and Family History were reviewed and updated.  Review of Systems: All other 10 point review of systems is negative.   Physical Exam:  vitals were not taken for this visit.  Wt Readings from Last 3 Encounters:  01/06/17 150 lb (68 kg)  11/18/16 149 lb 4 oz (67.7 kg)  10/21/16 149 lb (67.6 kg)    Ocular: Sclerae unicteric, pupils equal, round and reactive to light Ear-nose-throat: Oropharynx clear, dentition fair Lymphatic: No cervical, supraclavicular or axillary adenopathy Lungs no rales or rhonchi, good excursion bilaterally Heart regular rate and rhythm, no murmur appreciated Abd soft, nontender, positive bowel sounds, no liver  or spleen tip palpated on exam, no fluid wave  MSK no focal spinal tenderness, no joint edema Neuro: non-focal, well-oriented, appropriate affect Breasts: Deferred    Lab Results  Component Value Date   WBC 3.7 (L) 01/06/2017   HGB 11.5 (L) 01/06/2017   HCT 33.5 (L) 01/06/2017   MCV 85 01/06/2017   PLT 170 01/06/2017   No results found for: FERRITIN, IRON, TIBC, UIBC, IRONPCTSAT Lab Results  Component Value Date   RBC 3.93 01/06/2017   No results found for: KPAFRELGTCHN, LAMBDASER, KAPLAMBRATIO No results found for: IGGSERUM, IGA, IGMSERUM No results found for: Rachael Lester, SPEI   Chemistry      Component Value Date/Time   NA 143 01/06/2017 0847   K 4.3 01/06/2017 0847   CL 104 01/06/2017 0847   CO2 32 01/06/2017 0847   BUN 12 01/06/2017 0847   CREATININE 1.5 (H) 01/06/2017 0847      Component Value Date/Time   CALCIUM 10.0 01/06/2017 0847   ALKPHOS 67 01/06/2017 0847   AST 20 01/06/2017 0847   ALT 17 01/06/2017 0847   BILITOT 0.50 01/06/2017 0847      Impression and Plan: Rachael Lester is a very pleasant 61 yo African American female with history of limited stage small cell lung cancer. She was treated aggressively while living in [place] with both radiation and chemo. She did receive radiation to the brain during that time as well.   Unfortunately her scans were denied so we will try to get them the same day as her follow-up in 6 weeks.  She is doing fairly well despite some lingering fatigue and constipation. Hopefully adding Mirilax twice daily will help with the later.  Her CBC and CMP remain stable. We will plan to see her back in December.  All questions were answered and she is in agreement with the plan. She will contact our office with any questions or concerns. We can certainly see her sooner if need be.   Eliezer Bottom, NP 10/25/20182:28 PM

## 2017-01-26 ENCOUNTER — Other Ambulatory Visit: Payer: Self-pay | Admitting: *Deleted

## 2017-01-26 DIAGNOSIS — F064 Anxiety disorder due to known physiological condition: Secondary | ICD-10-CM

## 2017-01-26 DIAGNOSIS — C342 Malignant neoplasm of middle lobe, bronchus or lung: Secondary | ICD-10-CM

## 2017-01-26 DIAGNOSIS — Z1231 Encounter for screening mammogram for malignant neoplasm of breast: Secondary | ICD-10-CM

## 2017-01-26 MED ORDER — ALPRAZOLAM 0.25 MG PO TABS: 0.2500 mg | ORAL_TABLET | Freq: Two times a day (BID) | ORAL | 0 refills | Status: DC | PRN

## 2017-01-26 MED ORDER — OXYCODONE HCL 5 MG PO TABS: 5.0000 mg | ORAL_TABLET | ORAL | 0 refills | Status: DC | PRN

## 2017-02-02 MED FILL — oxyCODONE HCL 5 MG TABS: 5 | 10 days supply | Qty: 60 | Fill #0

## 2017-02-02 MED FILL — ALPRAZolam 0.25 MG TABS: 0.25 | 30 days supply | Qty: 60 | Fill #0

## 2017-02-17 ENCOUNTER — Other Ambulatory Visit: Payer: Self-pay

## 2017-02-17 ENCOUNTER — Encounter (HOSPITAL_BASED_OUTPATIENT_CLINIC_OR_DEPARTMENT_OTHER): Payer: Self-pay

## 2017-02-17 ENCOUNTER — Encounter: Payer: Self-pay | Admitting: Family

## 2017-02-17 ENCOUNTER — Ambulatory Visit (HOSPITAL_BASED_OUTPATIENT_CLINIC_OR_DEPARTMENT_OTHER)
Admission: RE | Admit: 2017-02-17 | Discharge: 2017-02-17 | Disposition: A | Discharge status: Home / Self Care | Payer: Medicaid Other | Source: Ambulatory Visit | Attending: Hematology & Oncology | Admitting: Hematology & Oncology

## 2017-02-17 ENCOUNTER — Ambulatory Visit (HOSPITAL_BASED_OUTPATIENT_CLINIC_OR_DEPARTMENT_OTHER): Payer: Medicaid Other

## 2017-02-17 ENCOUNTER — Ambulatory Visit (HOSPITAL_BASED_OUTPATIENT_CLINIC_OR_DEPARTMENT_OTHER): Payer: Medicaid Other | Admitting: Family

## 2017-02-17 ENCOUNTER — Other Ambulatory Visit (HOSPITAL_BASED_OUTPATIENT_CLINIC_OR_DEPARTMENT_OTHER): Payer: Medicaid Other

## 2017-02-17 VITALS — BP 161/89 | HR 74 | Temp 97.7°F | Resp 16 | Wt 158.0 lb

## 2017-02-17 DIAGNOSIS — I700 Atherosclerosis of aorta: Secondary | ICD-10-CM | POA: Diagnosis not present

## 2017-02-17 DIAGNOSIS — R05 Cough: Secondary | ICD-10-CM

## 2017-02-17 DIAGNOSIS — Q453 Other congenital malformations of pancreas and pancreatic duct: Secondary | ICD-10-CM | POA: Diagnosis not present

## 2017-02-17 DIAGNOSIS — J439 Emphysema, unspecified: Secondary | ICD-10-CM | POA: Diagnosis not present

## 2017-02-17 DIAGNOSIS — R188 Other ascites: Secondary | ICD-10-CM | POA: Insufficient documentation

## 2017-02-17 DIAGNOSIS — K5903 Drug induced constipation: Secondary | ICD-10-CM | POA: Insufficient documentation

## 2017-02-17 DIAGNOSIS — I313 Pericardial effusion (noninflammatory): Secondary | ICD-10-CM | POA: Insufficient documentation

## 2017-02-17 DIAGNOSIS — C3491 Malignant neoplasm of unspecified part of right bronchus or lung: Secondary | ICD-10-CM

## 2017-02-17 DIAGNOSIS — R093 Abnormal sputum: Secondary | ICD-10-CM | POA: Diagnosis not present

## 2017-02-17 DIAGNOSIS — R6883 Chills (without fever): Secondary | ICD-10-CM | POA: Diagnosis not present

## 2017-02-17 DIAGNOSIS — J219 Acute bronchiolitis, unspecified: Secondary | ICD-10-CM | POA: Insufficient documentation

## 2017-02-17 DIAGNOSIS — Z720 Tobacco use: Secondary | ICD-10-CM | POA: Diagnosis not present

## 2017-02-17 DIAGNOSIS — J988 Other specified respiratory disorders: Secondary | ICD-10-CM

## 2017-02-17 DIAGNOSIS — I2510 Atherosclerotic heart disease of native coronary artery without angina pectoris: Secondary | ICD-10-CM | POA: Insufficient documentation

## 2017-02-17 DIAGNOSIS — R5383 Other fatigue: Secondary | ICD-10-CM

## 2017-02-17 DIAGNOSIS — Z923 Personal history of irradiation: Secondary | ICD-10-CM

## 2017-02-17 DIAGNOSIS — R059 Cough, unspecified: Secondary | ICD-10-CM

## 2017-02-17 DIAGNOSIS — Z85118 Personal history of other malignant neoplasm of bronchus and lung: Secondary | ICD-10-CM

## 2017-02-17 DIAGNOSIS — T402X5A Adverse effect of other opioids, initial encounter: Secondary | ICD-10-CM

## 2017-02-17 DIAGNOSIS — Z95828 Presence of other vascular implants and grafts: Secondary | ICD-10-CM

## 2017-02-17 DIAGNOSIS — Z9221 Personal history of antineoplastic chemotherapy: Secondary | ICD-10-CM | POA: Diagnosis not present

## 2017-02-17 LAB — CBC WITH DIFFERENTIAL (CANCER CENTER ONLY)
BASO#: 0 10*3/uL (ref 0.0–0.2)
BASO%: 0.3 % (ref 0.0–2.0)
EOS ABS: 0.1 10*3/uL (ref 0.0–0.5)
EOS%: 3 % (ref 0.0–7.0)
HCT: 34.9 % (ref 34.8–46.6)
HEMOGLOBIN: 11.7 g/dL (ref 11.6–15.9)
LYMPH#: 1.1 10*3/uL (ref 0.9–3.3)
LYMPH%: 36.9 % (ref 14.0–48.0)
MCH: 29.3 pg (ref 26.0–34.0)
MCHC: 33.5 g/dL (ref 32.0–36.0)
MCV: 88 fL (ref 81–101)
MONO#: 0.4 10*3/uL (ref 0.1–0.9)
MONO%: 12.3 % (ref 0.0–13.0)
NEUT%: 47.5 % (ref 39.6–80.0)
NEUTROS ABS: 1.4 10*3/uL — AB (ref 1.5–6.5)
PLATELETS: 161 10*3/uL (ref 145–400)
RBC: 3.99 10*6/uL (ref 3.70–5.32)
RDW: 14.3 % (ref 11.1–15.7)
WBC: 3 10*3/uL — AB (ref 3.9–10.0)

## 2017-02-17 LAB — CMP (CANCER CENTER ONLY)
ALBUMIN: 3.3 g/dL (ref 3.3–5.5)
ALK PHOS: 65 U/L (ref 26–84)
ALT(SGPT): 15 U/L (ref 10–47)
AST: 17 U/L (ref 11–38)
BILIRUBIN TOTAL: 0.7 mg/dL (ref 0.20–1.60)
BUN: 18 mg/dL (ref 7–22)
CO2: 32 mEq/L (ref 18–33)
CREATININE: 1.5 mg/dL — AB (ref 0.6–1.2)
Calcium: 10 mg/dL (ref 8.0–10.3)
Chloride: 100 mEq/L (ref 98–108)
Glucose, Bld: 86 mg/dL (ref 73–118)
POTASSIUM: 4.4 meq/L (ref 3.3–4.7)
SODIUM: 141 meq/L (ref 128–145)
TOTAL PROTEIN: 7.2 g/dL (ref 6.4–8.1)

## 2017-02-17 LAB — LACTATE DEHYDROGENASE: LDH: 122 U/L — AB (ref 125–245)

## 2017-02-17 MED ORDER — METHYLPREDNISOLONE 4 MG PO TBPK: ORAL_TABLET | ORAL | 0 refills | Status: DC

## 2017-02-17 MED ORDER — AZITHROMYCIN 250 MG PO TABS: ORAL_TABLET | ORAL | 0 refills | Status: DC

## 2017-02-17 MED ORDER — SODIUM CHLORIDE 0.9% FLUSH
10.0000 mL | INTRAVENOUS | Status: DC | PRN
  Administered 2017-02-17: 10 mL via INTRAVENOUS
  Filled 2017-02-17: qty 10

## 2017-02-17 MED ORDER — IOPAMIDOL (ISOVUE-300) INJECTION 61%
100.0000 mL | Freq: Once | INTRAVENOUS | Status: AC | PRN
  Administered 2017-02-17: 80 mL via INTRAVENOUS

## 2017-02-17 MED ORDER — HEPARIN SOD (PORK) LOCK FLUSH 100 UNIT/ML IV SOLN
500.0000 [IU] | Freq: Once | INTRAVENOUS | Status: AC
  Administered 2017-02-17: 500 [IU] via INTRAVENOUS
  Filled 2017-02-17: qty 5

## 2017-02-17 MED FILL — METHYLPREDNISOLONE 4 MG TAB: 4 | 6 days supply | Qty: 21 | Fill #0

## 2017-02-17 MED FILL — AZITHROMYCIN 250 MG TABLET: 250 | 5 days supply | Qty: 6 | Fill #0

## 2017-02-17 NOTE — Progress Notes (Signed)
Hematology and Oncology Follow Up Visit  Rachael Lester [id number] 13-Jul-1955 61 y.o. 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 She had CT scans which showed possibility of inflammation/infection in the right upper and middle lobes of the lung. She is symptomatic with productive cough with yellow phlegm, chills and fatigue. Her SOB with over exertion is unchanged.  She denies fever, n/v, rash, dizziness, chest pain, palpitations, abdominal pain or changes in bowel or bladder habits.  No episodes of bleeding, no bruising or petechiae. No lymphadenopathy found on exam.  No swelling or tenderness in her extremities. The intermittent numbness and tingling in her extremities is unchanged. She has had some occasional right sided back pain that comes and goes.  She has a better appetite. Her weight is up 8 lbs. She is still smoking and will occasionally smoke marijuana to help stimulate her appetite. She is hydrating well. No problems swallowing.   ECOG Performance Status: 1 - Symptomatic but completely ambulatory  Medications:  Allergies as of 02/17/2017      Reactions   Delsym [dextromethorphan Polistirex Er] Hives, Itching      Medication List        Accurate as of 02/17/17 11:12 AM. Always use your most recent med list.          ALPRAZolam 0.25 MG tablet Commonly known as:  XANAX Take 1 tablet (0.25 mg total) 2 (two) times daily as needed by mouth for anxiety.   aspirin EC 81 MG tablet Take 81 mg by mouth daily.   carvedilol 6.25 MG tablet Commonly known as:  COREG   naloxegol oxalate 25 MG Tabs tablet Commonly known as:  MOVANTIK Take 1 tablet (25 mg total) by mouth daily.   oxyCODONE 5 MG immediate release  tablet Commonly known as:  Oxy IR/ROXICODONE Take 1 tablet (5 mg total) every 4 (four) hours as needed by mouth for severe pain.   pantoprazole 40 MG tablet Commonly known as:  PROTONIX Take 1 tablet (40 mg total) by mouth daily.   varenicline 1 MG tablet Commonly known as:  CHANTIX Take 1 tablet (1 mg total) by mouth 2 (two) times daily.       Allergies:  Allergies  Allergen Reactions  . Delsym [Dextromethorphan Polistirex Er] Hives and Itching    Past Medical History, Surgical history, Social history, and Family History were reviewed and updated.  Review of Systems: All other 10 point review of systems is negative.   Physical Exam:  vitals were not taken for this visit.   Wt Readings from Last 3 Encounters:  01/06/17 150 lb (68 kg)  11/18/16 149 lb 4 oz (67.7 kg)  10/21/16 149 lb (67.6 kg)    Ocular: Sclerae unicteric, pupils equal, round and reactive to light Ear-nose-throat: Oropharynx clear, dentition fair Lymphatic: No cervical, supraclavicular or axillary adenopathy Lungs no rales or rhonchi, good excursion bilaterally Heart regular rate and rhythm, no murmur appreciated Abd soft, nontender, positive bowel sounds, no liver or spleen tip palpated on exam, no fluid wave  MSK no focal spinal tenderness, no joint edema Neuro: non-focal, well-oriented, appropriate affect Breasts: Deferred   Lab Results  Component Value Date   WBC 3.0 (L) 02/17/2017   HGB 11.7 02/17/2017  HCT 34.9 02/17/2017   MCV 88 02/17/2017   PLT 161 02/17/2017   No results found for: FERRITIN, IRON, TIBC, UIBC, IRONPCTSAT Lab Results  Component Value Date   RBC 3.99 02/17/2017   No results found for: KPAFRELGTCHN, LAMBDASER, KAPLAMBRATIO No results found for: IGGSERUM, IGA, IGMSERUM No results found for: Odetta Pink, SPEI   Chemistry      Component Value Date/Time   NA 143 01/06/2017 0847   K 4.3 01/06/2017 0847   CL 104  01/06/2017 0847   CO2 32 01/06/2017 0847   BUN 12 01/06/2017 0847   CREATININE 1.5 (H) 01/06/2017 0847      Component Value Date/Time   CALCIUM 10.0 01/06/2017 0847   ALKPHOS 67 01/06/2017 0847   AST 20 01/06/2017 0847   ALT 17 01/06/2017 0847   BILITOT 0.50 01/06/2017 0847      Impression and Plan: Rachael Lester is a very pleasant 61 yo African American female with history of limited stage small cell lung cancer. She was treated very aggressively while living in [place] with her daughter. She received both chemo and radiation. She also received radiation to the brain.  She had scans today which showed possible infectious process in the right upper and middle lobes of the lung. She is symptomatic with fatigue, productive cough and chills. No fever or worsening of her baseline SOB.  We will have her start a z-pack as well as a medrol dose pack. She will let us know if her symptoms do not resolve or worsen.  We will go ahead and plan to see her back again in another 2 months for repeat scans, port flush, lab and follow-up.  She promises to contact our office with any questions or concerns. We can certainly see her sooner if need be.   Eliezer Bottom, NP 12/6/201811:12 AM

## 2017-03-10 ENCOUNTER — Other Ambulatory Visit: Payer: Self-pay | Admitting: *Deleted

## 2017-03-10 DIAGNOSIS — Z1231 Encounter for screening mammogram for malignant neoplasm of breast: Secondary | ICD-10-CM

## 2017-03-10 DIAGNOSIS — F064 Anxiety disorder due to known physiological condition: Secondary | ICD-10-CM

## 2017-03-10 DIAGNOSIS — C342 Malignant neoplasm of middle lobe, bronchus or lung: Secondary | ICD-10-CM

## 2017-03-10 MED ORDER — ALPRAZOLAM 0.25 MG PO TABS: 0.2500 mg | ORAL_TABLET | Freq: Two times a day (BID) | ORAL | 0 refills | Status: DC | PRN

## 2017-03-10 MED ORDER — OXYCODONE HCL 5 MG PO TABS: 5.0000 mg | ORAL_TABLET | ORAL | 0 refills | Status: DC | PRN

## 2017-04-02 ENCOUNTER — Other Ambulatory Visit: Payer: Self-pay | Admitting: Hematology & Oncology

## 2017-04-04 ENCOUNTER — Other Ambulatory Visit: Payer: Self-pay

## 2017-04-04 MED ORDER — OXYCODONE HCL 5 MG PO TABS: 5.0000 mg | ORAL_TABLET | ORAL | 0 refills | Status: DC | PRN

## 2017-04-06 ENCOUNTER — Other Ambulatory Visit: Payer: Self-pay | Admitting: *Deleted

## 2017-04-06 MED ORDER — OXYCODONE HCL 5 MG PO TABS: 5.0000 mg | ORAL_TABLET | ORAL | 0 refills | Status: DC | PRN

## 2017-04-18 ENCOUNTER — Other Ambulatory Visit: Payer: Self-pay | Admitting: Family

## 2017-04-18 DIAGNOSIS — C3490 Malignant neoplasm of unspecified part of unspecified bronchus or lung: Secondary | ICD-10-CM

## 2017-04-20 ENCOUNTER — Ambulatory Visit (HOSPITAL_BASED_OUTPATIENT_CLINIC_OR_DEPARTMENT_OTHER)
Admission: RE | Admit: 2017-04-20 | Discharge: 2017-04-20 | Disposition: A | Discharge status: Home / Self Care | Payer: Medicaid Other | Source: Ambulatory Visit | Attending: Family | Admitting: Family

## 2017-04-20 ENCOUNTER — Encounter: Payer: Self-pay | Admitting: Family

## 2017-04-20 ENCOUNTER — Other Ambulatory Visit: Payer: Self-pay

## 2017-04-20 ENCOUNTER — Encounter (HOSPITAL_BASED_OUTPATIENT_CLINIC_OR_DEPARTMENT_OTHER): Payer: Self-pay

## 2017-04-20 ENCOUNTER — Inpatient Hospital Stay: Payer: Medicaid Other

## 2017-04-20 ENCOUNTER — Inpatient Hospital Stay: Payer: Medicaid Other | Attending: Family | Admitting: Family

## 2017-04-20 ENCOUNTER — Ambulatory Visit (HOSPITAL_BASED_OUTPATIENT_CLINIC_OR_DEPARTMENT_OTHER): Payer: Medicaid Other

## 2017-04-20 VITALS — BP 141/95 | HR 94 | Temp 98.3°F | Resp 16 | Wt 158.2 lb

## 2017-04-20 DIAGNOSIS — I313 Pericardial effusion (noninflammatory): Secondary | ICD-10-CM | POA: Insufficient documentation

## 2017-04-20 DIAGNOSIS — Z85118 Personal history of other malignant neoplasm of bronchus and lung: Secondary | ICD-10-CM | POA: Insufficient documentation

## 2017-04-20 DIAGNOSIS — C3491 Malignant neoplasm of unspecified part of right bronchus or lung: Secondary | ICD-10-CM

## 2017-04-20 DIAGNOSIS — Z452 Encounter for adjustment and management of vascular access device: Secondary | ICD-10-CM | POA: Diagnosis not present

## 2017-04-20 DIAGNOSIS — J988 Other specified respiratory disorders: Secondary | ICD-10-CM

## 2017-04-20 DIAGNOSIS — I700 Atherosclerosis of aorta: Secondary | ICD-10-CM | POA: Diagnosis not present

## 2017-04-20 DIAGNOSIS — C3490 Malignant neoplasm of unspecified part of unspecified bronchus or lung: Secondary | ICD-10-CM

## 2017-04-20 LAB — CBC WITH DIFFERENTIAL (CANCER CENTER ONLY)
BASOS ABS: 0 10*3/uL (ref 0.0–0.1)
BASOS PCT: 0 %
EOS PCT: 3 %
Eosinophils Absolute: 0.2 10*3/uL (ref 0.0–0.5)
HEMATOCRIT: 37.5 % (ref 34.8–46.6)
Hemoglobin: 12.8 g/dL (ref 11.6–15.9)
Lymphocytes Relative: 27 %
Lymphs Abs: 1.4 10*3/uL (ref 0.9–3.3)
MCH: 29.1 pg (ref 26.0–34.0)
MCHC: 34.1 g/dL (ref 32.0–36.0)
MCV: 85.2 fL (ref 81.0–101.0)
MONO ABS: 0.5 10*3/uL (ref 0.1–0.9)
MONOS PCT: 10 %
Neutro Abs: 3 10*3/uL (ref 1.5–6.5)
Neutrophils Relative %: 60 %
PLATELETS: 198 10*3/uL (ref 145–400)
RBC: 4.4 MIL/uL (ref 3.70–5.32)
RDW: 14.2 % (ref 11.1–15.7)
WBC Count: 5.1 10*3/uL (ref 3.9–10.0)

## 2017-04-20 LAB — CMP (CANCER CENTER ONLY)
ALBUMIN: 4.3 g/dL (ref 3.5–5.0)
ALT: 12 U/L (ref 0–55)
AST: 19 U/L (ref 5–34)
Alkaline Phosphatase: 79 U/L (ref 26–84)
Anion gap: 9 (ref 5–15)
BILIRUBIN TOTAL: 1.1 mg/dL (ref 0.2–1.2)
BUN: 13 mg/dL (ref 7–22)
CALCIUM: 10.1 mg/dL (ref 8.0–10.3)
CHLORIDE: 102 mmol/L (ref 98–108)
CO2: 29 mmol/L (ref 18–33)
CREATININE: 1 mg/dL (ref 0.60–1.10)
GLUCOSE: 90 mg/dL (ref 73–118)
Potassium: 3.8 mmol/L (ref 3.3–4.7)
Sodium: 140 mmol/L (ref 128–145)
Total Protein: 7.8 g/dL (ref 6.4–8.1)

## 2017-04-20 LAB — LACTATE DEHYDROGENASE: LDH: 132 U/L (ref 125–245)

## 2017-04-20 MED ORDER — IOPAMIDOL (ISOVUE-300) INJECTION 61%
100.0000 mL | Freq: Once | INTRAVENOUS | Status: AC | PRN
  Administered 2017-04-20: 80 mL via INTRAVENOUS

## 2017-04-20 MED ORDER — SODIUM CHLORIDE 0.9% FLUSH
10.0000 mL | INTRAVENOUS | Status: DC | PRN
  Administered 2017-04-20: 10 mL via INTRAVENOUS
  Filled 2017-04-20: qty 10

## 2017-04-20 MED ORDER — HEPARIN SOD (PORK) LOCK FLUSH 100 UNIT/ML IV SOLN
500.0000 [IU] | Freq: Once | INTRAVENOUS | Status: AC
  Administered 2017-04-20: 500 [IU] via INTRAVENOUS
  Filled 2017-04-20: qty 5

## 2017-04-20 NOTE — Progress Notes (Signed)
Hematology and Oncology Follow Up Visit  AILYNE PAWLEY [id number] [birth year] 62 y.o. 04/20/2017   Principle Diagnosis:  Limited stage small cell lung cancer of the right lung  Past Therapy: Status post chemotherapy and radiation therapy in [place] - completed in January 2018  Current Therapy:   Observation   Interim History:  Ms. Flavell is here today for follow-up. She had repeat scans this morning which showed no evidence of recurrent or metastatic disease.  No fever, chills, n/v, cough, rash, dizziness, SOB, chest pain, palpitations, abdominal pain or changes in bowel or bladder habits.  No episodes of bleeding, no bruising or petechiae. No lymphadenopathy.  She has had GERD despite taking her Protonix daily. She will call her PCP and see if this can be increased to BID.  She is taking Mirilax as needed for constipation.  The numbness and tingling in her extremities is unchanged. No swelling or tenderness in her extremities. No c/o pain.  She has a good appetite and is staying well hydrated. Her weight is stable.   ECOG Performance Status: 0 - Asymptomatic  Medications:  Allergies as of 04/20/2017      Reactions   Delsym [dextromethorphan Polistirex Er] Hives, Itching      Medication List        Accurate as of 04/20/17 10:35 AM. Always use your most recent med list.          ALPRAZolam 0.25 MG tablet Commonly known as:  XANAX Take 1 tablet (0.25 mg total) by mouth 2 (two) times daily as needed for anxiety.   aspirin EC 81 MG tablet Take 81 mg by mouth daily.   carvedilol 3.125 MG tablet Commonly known as:  COREG take 1 tablet by mouth twice a day with meals   naloxegol oxalate 25 MG Tabs tablet Commonly known as:  MOVANTIK Take 1 tablet (25 mg total) by mouth daily.   oxyCODONE 5 MG immediate release tablet Commonly known as:  Oxy IR/ROXICODONE Take 1 tablet (5 mg total) by mouth every 4 (four) hours as needed for severe pain.   pantoprazole 40 MG  tablet Commonly known as:  PROTONIX Take 1 tablet (40 mg total) by mouth daily.       Allergies:  Allergies  Allergen Reactions  . Delsym [Dextromethorphan Polistirex Er] Hives and Itching    Past Medical History, Surgical history, Social history, and Family History were reviewed and updated.  Review of Systems: All other 10 point review of systems is negative.   Physical Exam:  weight is 158 lb 3.2 oz (71.8 kg). Her oral temperature is 98.3 F (36.8 C). Her blood pressure is 141/95 (abnormal) and her pulse is 94. Her respiration is 16 and oxygen saturation is 98%.   Wt Readings from Last 3 Encounters:  04/20/17 158 lb 3.2 oz (71.8 kg)  02/17/17 158 lb (71.7 kg)  01/06/17 150 lb (68 kg)    Ocular: Sclerae unicteric, pupils equal, round and reactive to light Ear-nose-throat: Oropharynx clear, dentition fair Lymphatic: No cervical, supraclavicular or axillary adenopathy Lungs no rales or rhonchi, good excursion bilaterally Heart regular rate and rhythm, no murmur appreciated Abd soft, nontender, positive bowel sounds, no liver or spleen tip palpated on exam, no fluid wave  MSK no focal spinal tenderness, no joint edema Neuro: non-focal, well-oriented, appropriate affect Breasts: Deferred   Lab Results  Component Value Date   WBC 5.1 04/20/2017   HGB 11.7 02/17/2017   HCT 37.5 04/20/2017   MCV 85.2 04/20/2017  PLT 198 04/20/2017   No results found for: FERRITIN, IRON, TIBC, UIBC, IRONPCTSAT Lab Results  Component Value Date   RBC 4.40 04/20/2017   No results found for: KPAFRELGTCHN, LAMBDASER, KAPLAMBRATIO No results found for: IGGSERUM, IGA, IGMSERUM No results found for: Odetta Pink, SPEI   Chemistry      Component Value Date/Time   NA 141 02/17/2017 1100   K 4.4 02/17/2017 1100   CL 100 02/17/2017 1100   CO2 32 02/17/2017 1100   BUN 18 02/17/2017 1100   CREATININE 1.5 (H) 02/17/2017 1100        Component Value Date/Time   CALCIUM 10.0 02/17/2017 1100   ALKPHOS 65 02/17/2017 1100   AST 17 02/17/2017 1100   ALT 15 02/17/2017 1100   BILITOT 0.70 02/17/2017 1100      Impression and Plan: Ms. Vanrossum is a very pleasant 62 yo African American female with history of limited stage small cell lung cancer. She was treated very aggressively while living in [place] with her daughter. She received both chemo and radiation. She received radiation to the brain as well.  Her scans today showed no evidence of recurrent or metastatic disease. She is doing well and has no complaints at this time.  We will plan to see her back in another 4 months for follow-up, lab and repeat scans.  She will contact our office with any questions or concerns. We can certainly see her sooner if need be.   Laverna Peace, NP 2/6/201910:35 AM

## 2017-04-20 NOTE — Patient Instructions (Signed)
Implanted Port Home Guide An implanted port is a type of central line that is placed under the skin. Central lines are used to provide IV access when treatment or nutrition needs to be given through a person's veins. Implanted ports are used for long-term IV access. An implanted port may be placed because:  You need IV medicine that would be irritating to the small veins in your hands or arms.  You need long-term IV medicines, such as antibiotics.  You need IV nutrition for a long period.  You need frequent blood draws for lab tests.  You need dialysis.  Implanted ports are usually placed in the chest area, but they can also be placed in the upper arm, the abdomen, or the leg. An implanted port has two main parts:  Reservoir. The reservoir is round and will appear as a small, raised area under your skin. The reservoir is the part where a needle is inserted to give medicines or draw blood.  Catheter. The catheter is a thin, flexible tube that extends from the reservoir. The catheter is placed into a large vein. Medicine that is inserted into the reservoir goes into the catheter and then into the vein.  How will I care for my incision site? Do not get the incision site wet. Bathe or shower as directed by your health care provider. How is my port accessed? Special steps must be taken to access the port:  Before the port is accessed, a numbing cream can be placed on the skin. This helps numb the skin over the port site.  Your health care provider uses a sterile technique to access the port. ? Your health care provider must put on a mask and sterile gloves. ? The skin over your port is cleaned carefully with an antiseptic and allowed to dry. ? The port is gently pinched between sterile gloves, and a needle is inserted into the port.  Only "non-coring" port needles should be used to access the port. Once the port is accessed, a blood return should be checked. This helps ensure that the port  is in the vein and is not clogged.  If your port needs to remain accessed for a constant infusion, a clear (transparent) bandage will be placed over the needle site. The bandage and needle will need to be changed every week, or as directed by your health care provider.  Keep the bandage covering the needle clean and dry. Do not get it wet. Follow your health care provider's instructions on how to take a shower or bath while the port is accessed.  If your port does not need to stay accessed, no bandage is needed over the port.  What is flushing? Flushing helps keep the port from getting clogged. Follow your health care provider's instructions on how and when to flush the port. Ports are usually flushed with saline solution or a medicine called heparin. The need for flushing will depend on how the port is used.  If the port is used for intermittent medicines or blood draws, the port will need to be flushed: ? After medicines have been given. ? After blood has been drawn. ? As part of routine maintenance.  If a constant infusion is running, the port may not need to be flushed.  How long will my port stay implanted? The port can stay in for as long as your health care provider thinks it is needed. When it is time for the port to come out, surgery will be   done to remove it. The procedure is similar to the one performed when the port was put in. When should I seek immediate medical care? When you have an implanted port, you should seek immediate medical care if:  You notice a bad smell coming from the incision site.  You have swelling, redness, or drainage at the incision site.  You have more swelling or pain at the port site or the surrounding area.  You have a fever that is not controlled with medicine.  This information is not intended to replace advice given to you by your health care provider. Make sure you discuss any questions you have with your health care provider. Document  Released: 03/01/2005 Document Revised: 08/07/2015 Document Reviewed: 11/06/2012 Elsevier Interactive Patient Education  2017 [employer]  

## 2017-05-13 ENCOUNTER — Other Ambulatory Visit: Payer: Self-pay | Admitting: Hematology & Oncology

## 2017-06-13 ENCOUNTER — Other Ambulatory Visit: Payer: Self-pay | Admitting: Hematology & Oncology

## 2017-06-13 DIAGNOSIS — F064 Anxiety disorder due to known physiological condition: Secondary | ICD-10-CM

## 2017-06-13 DIAGNOSIS — Z1231 Encounter for screening mammogram for malignant neoplasm of breast: Secondary | ICD-10-CM

## 2017-06-13 DIAGNOSIS — C342 Malignant neoplasm of middle lobe, bronchus or lung: Secondary | ICD-10-CM

## 2017-06-13 MED ORDER — OXYCODONE HCL 5 MG PO TABS: ORAL_TABLET | ORAL | 0 refills | Status: DC

## 2017-06-14 ENCOUNTER — Other Ambulatory Visit: Payer: Self-pay | Admitting: *Deleted

## 2017-06-14 MED ORDER — OXYCODONE HCL 5 MG PO TABS: ORAL_TABLET | ORAL | 0 refills | Status: DC

## 2017-07-22 ENCOUNTER — Other Ambulatory Visit: Payer: Self-pay | Admitting: *Deleted

## 2017-07-22 MED ORDER — OXYCODONE HCL 5 MG PO TABS: ORAL_TABLET | ORAL | 0 refills | Status: DC

## 2017-07-22 MED FILL — oxyCODONE HCL 5 MG TABS: 5 | 10 days supply | Qty: 60 | Fill #0

## 2017-08-17 ENCOUNTER — Other Ambulatory Visit (HOSPITAL_BASED_OUTPATIENT_CLINIC_OR_DEPARTMENT_OTHER): Payer: Medicaid Other

## 2017-08-17 ENCOUNTER — Other Ambulatory Visit: Payer: Medicaid Other

## 2017-08-17 ENCOUNTER — Ambulatory Visit (HOSPITAL_BASED_OUTPATIENT_CLINIC_OR_DEPARTMENT_OTHER): Admission: RE | Admit: 2017-08-17 | Payer: Medicare Other | Source: Ambulatory Visit

## 2017-08-17 ENCOUNTER — Ambulatory Visit: Payer: Medicaid Other | Admitting: Family

## 2017-08-17 ENCOUNTER — Ambulatory Visit (HOSPITAL_BASED_OUTPATIENT_CLINIC_OR_DEPARTMENT_OTHER): Payer: Medicare Other

## 2017-08-23 ENCOUNTER — Other Ambulatory Visit: Payer: Medicaid Other

## 2017-08-23 ENCOUNTER — Ambulatory Visit (HOSPITAL_BASED_OUTPATIENT_CLINIC_OR_DEPARTMENT_OTHER): Payer: Medicaid Other

## 2017-08-23 ENCOUNTER — Ambulatory Visit: Payer: Medicaid Other | Admitting: Family

## 2017-08-26 ENCOUNTER — Inpatient Hospital Stay: Payer: Medicaid Other

## 2017-08-26 ENCOUNTER — Other Ambulatory Visit: Payer: Self-pay

## 2017-08-26 ENCOUNTER — Ambulatory Visit (HOSPITAL_BASED_OUTPATIENT_CLINIC_OR_DEPARTMENT_OTHER): Admission: RE | Admit: 2017-08-26 | Payer: Medicaid Other | Source: Ambulatory Visit

## 2017-08-26 ENCOUNTER — Ambulatory Visit (HOSPITAL_BASED_OUTPATIENT_CLINIC_OR_DEPARTMENT_OTHER): Payer: Medicaid Other

## 2017-08-26 ENCOUNTER — Encounter: Payer: Self-pay | Admitting: Hematology & Oncology

## 2017-08-26 ENCOUNTER — Inpatient Hospital Stay: Payer: Medicaid Other | Attending: Family | Admitting: Hematology & Oncology

## 2017-08-26 VITALS — BP 122/85 | HR 92 | Temp 97.7°F | Resp 18 | Wt 160.0 lb

## 2017-08-26 DIAGNOSIS — C3491 Malignant neoplasm of unspecified part of right bronchus or lung: Secondary | ICD-10-CM

## 2017-08-26 DIAGNOSIS — M79601 Pain in right arm: Secondary | ICD-10-CM

## 2017-08-26 DIAGNOSIS — Z85118 Personal history of other malignant neoplasm of bronchus and lung: Secondary | ICD-10-CM | POA: Insufficient documentation

## 2017-08-26 LAB — CMP (CANCER CENTER ONLY)
ALBUMIN: 3.3 g/dL — AB (ref 3.5–5.0)
ALK PHOS: 90 U/L — AB (ref 26–84)
ALT: 17 U/L (ref 10–47)
ANION GAP: 6 (ref 5–15)
AST: 26 U/L (ref 11–38)
BUN: 22 mg/dL (ref 7–22)
CHLORIDE: 106 mmol/L (ref 98–108)
CO2: 30 mmol/L (ref 18–33)
Calcium: 9.7 mg/dL (ref 8.0–10.3)
Creatinine: 1.3 mg/dL — ABNORMAL HIGH (ref 0.60–1.20)
Glucose, Bld: 86 mg/dL (ref 73–118)
POTASSIUM: 4.2 mmol/L (ref 3.3–4.7)
Sodium: 142 mmol/L (ref 128–145)
Total Bilirubin: 0.7 mg/dL (ref 0.2–1.6)
Total Protein: 7 g/dL (ref 6.4–8.1)

## 2017-08-26 LAB — CBC WITH DIFFERENTIAL (CANCER CENTER ONLY)
BASOS PCT: 1 %
Basophils Absolute: 0 10*3/uL (ref 0.0–0.1)
EOS ABS: 0.2 10*3/uL (ref 0.0–0.5)
Eosinophils Relative: 4 %
HEMATOCRIT: 35 % (ref 34.8–46.6)
Hemoglobin: 11.8 g/dL (ref 11.6–15.9)
LYMPHS ABS: 1 10*3/uL (ref 0.9–3.3)
Lymphocytes Relative: 26 %
MCH: 29.3 pg (ref 26.0–34.0)
MCHC: 33.7 g/dL (ref 32.0–36.0)
MCV: 86.8 fL (ref 81.0–101.0)
MONO ABS: 0.4 10*3/uL (ref 0.1–0.9)
MONOS PCT: 11 %
NEUTROS ABS: 2.2 10*3/uL (ref 1.5–6.5)
NEUTROS PCT: 58 %
Platelet Count: 177 10*3/uL (ref 145–400)
RBC: 4.03 MIL/uL (ref 3.70–5.32)
RDW: 14.6 % (ref 11.1–15.7)
WBC Count: 3.8 10*3/uL — ABNORMAL LOW (ref 3.9–10.0)

## 2017-08-26 LAB — LACTATE DEHYDROGENASE: LDH: 132 U/L (ref 125–245)

## 2017-08-26 MED ORDER — HEPARIN SOD (PORK) LOCK FLUSH 100 UNIT/ML IV SOLN
500.0000 [IU] | Freq: Once | INTRAVENOUS | Status: AC
  Administered 2017-08-26: 500 [IU] via INTRAVENOUS
  Filled 2017-08-26: qty 5

## 2017-08-26 MED ORDER — SODIUM CHLORIDE 0.9% FLUSH
10.0000 mL | INTRAVENOUS | Status: AC | PRN
  Administered 2017-08-26: 10 mL via INTRAVENOUS
  Filled 2017-08-26: qty 10

## 2017-08-26 NOTE — Patient Instructions (Signed)
Implanted Port Home Guide An implanted port is a type of central line that is placed under the skin. Central lines are used to provide IV access when treatment or nutrition needs to be given through a person's veins. Implanted ports are used for long-term IV access. An implanted port may be placed because:  You need IV medicine that would be irritating to the small veins in your hands or arms.  You need long-term IV medicines, such as antibiotics.  You need IV nutrition for a long period.  You need frequent blood draws for lab tests.  You need dialysis.  Implanted ports are usually placed in the chest area, but they can also be placed in the upper arm, the abdomen, or the leg. An implanted port has two main parts:  Reservoir. The reservoir is round and will appear as a small, raised area under your skin. The reservoir is the part where a needle is inserted to give medicines or draw blood.  Catheter. The catheter is a thin, flexible tube that extends from the reservoir. The catheter is placed into a large vein. Medicine that is inserted into the reservoir goes into the catheter and then into the vein.  How will I care for my incision site? Do not get the incision site wet. Bathe or shower as directed by your health care provider. How is my port accessed? Special steps must be taken to access the port:  Before the port is accessed, a numbing cream can be placed on the skin. This helps numb the skin over the port site.  Your health care provider uses a sterile technique to access the port. ? Your health care provider must put on a mask and sterile gloves. ? The skin over your port is cleaned carefully with an antiseptic and allowed to dry. ? The port is gently pinched between sterile gloves, and a needle is inserted into the port.  Only "non-coring" port needles should be used to access the port. Once the port is accessed, a blood return should be checked. This helps ensure that the port  is in the vein and is not clogged.  If your port needs to remain accessed for a constant infusion, a clear (transparent) bandage will be placed over the needle site. The bandage and needle will need to be changed every week, or as directed by your health care provider.  Keep the bandage covering the needle clean and dry. Do not get it wet. Follow your health care provider's instructions on how to take a shower or bath while the port is accessed.  If your port does not need to stay accessed, no bandage is needed over the port.  What is flushing? Flushing helps keep the port from getting clogged. Follow your health care provider's instructions on how and when to flush the port. Ports are usually flushed with saline solution or a medicine called heparin. The need for flushing will depend on how the port is used.  If the port is used for intermittent medicines or blood draws, the port will need to be flushed: ? After medicines have been given. ? After blood has been drawn. ? As part of routine maintenance.  If a constant infusion is running, the port may not need to be flushed.  How long will my port stay implanted? The port can stay in for as long as your health care provider thinks it is needed. When it is time for the port to come out, surgery will be   done to remove it. The procedure is similar to the one performed when the port was put in. When should I seek immediate medical care? When you have an implanted port, you should seek immediate medical care if:  You notice a bad smell coming from the incision site.  You have swelling, redness, or drainage at the incision site.  You have more swelling or pain at the port site or the surrounding area.  You have a fever that is not controlled with medicine.  This information is not intended to replace advice given to you by your health care provider. Make sure you discuss any questions you have with your health care provider. Document  Released: 03/01/2005 Document Revised: 08/07/2015 Document Reviewed: 11/06/2012 Elsevier Interactive Patient Education  2017 [employer]  

## 2017-08-26 NOTE — Progress Notes (Signed)
Hematology and Oncology Follow Up Visit  Rachael Lester [id number] 07-03-1955 62 y.o. 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 Loscalzo is here today for follow-up.  She is doing all right.  She really has no specific complaints.  She has had no nausea or vomiting.  She is had no cough.  She is still smoking.  She does complain some pain in the right shoulder and upper arm.  She has a Port-A-Cath on the right side.  I think we have to get a Doppler of the right arm to make sure that there is no thromboembolic disease.  Her insurance company would not let us do a CT scan today.  We will set one up or see her back in August.  She will be going to [place] next week to take care of her for her grandchildren so that her son and daughter-in-law can go on vacation.  She has had a good appetite.  She is had no change in bowel or bladder habits.  She has had no leg swelling.  She has had no fever.  She is had no bleeding.  Overall, her performance status is ECOG 1.     Medications:  Allergies as of 08/26/2017      Reactions   Delsym [dextromethorphan Polistirex Er] Hives, Itching      Medication List        Accurate as of 08/26/17  9:28 AM. Always use your most recent med list.          ALPRAZolam 0.25 MG tablet Commonly known as:  XANAX TAKE 1 TABLET BY MOUTH TWICE A DAY IF NEEDED FOR ANXIETY   aspirin EC 81 MG tablet Take 81 mg by mouth daily.   atorvastatin 20 MG tablet Commonly known as:  LIPITOR Take 20 mg by mouth at bedtime.   carvedilol 3.125 MG tablet Commonly known as:  COREG TAKE 1 TABLET BY MOUTH TWICE A DAY WITH MEALS   naloxegol oxalate 25 MG Tabs tablet Commonly known as:  MOVANTIK Take 1 tablet (25 mg total) by mouth daily.   oxyCODONE 5  MG immediate release tablet Commonly known as:  Oxy IR/ROXICODONE take 1 tablet by mouth every 4 hours if needed for severe pain   pantoprazole 40 MG tablet Commonly known as:  PROTONIX Take 1 tablet (40 mg total) by mouth daily.   Vitamin D (Ergocalciferol) 50000 units Caps capsule Commonly known as:  DRISDOL Take 50,000 Units by mouth once a week.       Allergies:  Allergies  Allergen Reactions  . Delsym [Dextromethorphan Polistirex Er] Hives and Itching    Past Medical History, Surgical history, Social history, and Family History were reviewed and updated.  Review of Systems: Review of Systems  Constitutional: Negative.   HENT: Negative.   Eyes: Negative.   Respiratory: Negative.   Cardiovascular: Negative.   Gastrointestinal: Negative.   Genitourinary: Negative.   Musculoskeletal: Negative.   Skin: Negative.   Neurological: Negative.   Endo/Heme/Allergies: Negative.   Psychiatric/Behavioral: Negative.      Physical Exam:  weight is 160 lb (72.6 kg). Her oral temperature is 97.7 F (36.5 C). Her blood pressure is 122/85 and her pulse is 92. Her respiration is 18 and oxygen saturation is 96%.   Wt Readings  from Last 3 Encounters:  08/26/17 160 lb (72.6 kg)  04/20/17 158 lb 3.2 oz (71.8 kg)  02/17/17 158 lb (71.7 kg)    Physical Exam  Constitutional: She is oriented to person, place, and time.  HENT:  Head: Normocephalic and atraumatic.  Mouth/Throat: Oropharynx is clear and moist.  Eyes: Pupils are equal, round, and reactive to light. EOM are normal.  Neck: Normal range of motion.  Cardiovascular: Normal rate, regular rhythm and normal heart sounds.  Pulmonary/Chest: Effort normal and breath sounds normal.  Abdominal: Soft. Bowel sounds are normal.  Musculoskeletal: Normal range of motion. She exhibits no edema, tenderness or deformity.  Lymphadenopathy:    She has no cervical adenopathy.  Neurological: She is alert and oriented to person, place, and  time.  Skin: Skin is warm and dry. No rash noted. No erythema.  Psychiatric: She has a normal mood and affect. Her behavior is normal. Judgment and thought content normal.  Vitals reviewed.    Lab Results  Component Value Date   WBC 3.8 (L) 08/26/2017   HGB 11.8 08/26/2017   HCT 35.0 08/26/2017   MCV 86.8 08/26/2017   PLT 177 08/26/2017   No results found for: FERRITIN, IRON, TIBC, UIBC, IRONPCTSAT Lab Results  Component Value Date   RBC 4.03 08/26/2017   No results found for: KPAFRELGTCHN, LAMBDASER, KAPLAMBRATIO No results found for: IGGSERUM, IGA, IGMSERUM No results found for: Odetta Pink, SPEI   Chemistry      Component Value Date/Time   NA 142 08/26/2017 0821   NA 141 02/17/2017 1100   K 4.2 08/26/2017 0821   K 4.4 02/17/2017 1100   CL 106 08/26/2017 0821   CL 100 02/17/2017 1100   CO2 30 08/26/2017 0821   CO2 32 02/17/2017 1100   BUN 22 08/26/2017 0821   BUN 18 02/17/2017 1100   CREATININE 1.30 (H) 08/26/2017 0821   CREATININE 1.5 (H) 02/17/2017 1100      Component Value Date/Time   CALCIUM 9.7 08/26/2017 0821   CALCIUM 10.0 02/17/2017 1100   ALKPHOS 90 (H) 08/26/2017 0821   ALKPHOS 65 02/17/2017 1100   AST 26 08/26/2017 0821   ALT 17 08/26/2017 0821   ALT 15 02/17/2017 1100   BILITOT 0.7 08/26/2017 0821      Impression and Plan: Ms. Selner is a very pleasant 62 yo African American female with history of limited stage small cell lung cancer. She was treated very aggressively while living in [place] with her daughter. She received both chemo and radiation.   We still need to monitor the small cell lung cancer for recurrence.  We will see about doing a CT scan in August.  Her last scan was done back in February.  She needs to have a Doppler done of the right arm.  I am not sure why she has this pain.  I worry that she has the Port-A-Cath on the right side.  We had to think about getting the  Port-A-Cath removed.  We will see her back in August.  She will be going to [place] next week to help out with her grandchildren while her son and daughter-in-law go on a cruise.    Volanda Napoleon, MD 6/14/20199:28 AM

## 2017-08-26 NOTE — Addendum Note (Signed)
Addended by: Rico Ala on: 08/26/2017 09:04 AM   Modules accepted: Orders, SmartSet

## 2017-09-19 ENCOUNTER — Other Ambulatory Visit: Payer: Self-pay | Admitting: *Deleted

## 2017-10-20 ENCOUNTER — Other Ambulatory Visit: Payer: Self-pay | Admitting: Family

## 2017-10-20 DIAGNOSIS — C3491 Malignant neoplasm of unspecified part of right bronchus or lung: Secondary | ICD-10-CM

## 2017-10-21 ENCOUNTER — Ambulatory Visit (HOSPITAL_BASED_OUTPATIENT_CLINIC_OR_DEPARTMENT_OTHER): Payer: Medicaid Other

## 2017-10-21 ENCOUNTER — Ambulatory Visit (HOSPITAL_BASED_OUTPATIENT_CLINIC_OR_DEPARTMENT_OTHER)
Admission: RE | Admit: 2017-10-21 | Discharge: 2017-10-21 | Disposition: A | Discharge status: Home / Self Care | Payer: Medicaid Other | Source: Ambulatory Visit | Attending: Hematology & Oncology | Admitting: Hematology & Oncology

## 2017-10-21 ENCOUNTER — Inpatient Hospital Stay: Payer: Medicaid Other

## 2017-10-21 ENCOUNTER — Inpatient Hospital Stay: Payer: Medicaid Other | Attending: Family | Admitting: Hematology & Oncology

## 2017-10-21 ENCOUNTER — Other Ambulatory Visit: Payer: Self-pay

## 2017-10-21 ENCOUNTER — Other Ambulatory Visit: Payer: Self-pay | Admitting: Family

## 2017-10-21 ENCOUNTER — Encounter (HOSPITAL_BASED_OUTPATIENT_CLINIC_OR_DEPARTMENT_OTHER): Payer: Self-pay

## 2017-10-21 ENCOUNTER — Encounter: Payer: Self-pay | Admitting: Hematology & Oncology

## 2017-10-21 ENCOUNTER — Ambulatory Visit (HOSPITAL_BASED_OUTPATIENT_CLINIC_OR_DEPARTMENT_OTHER)
Admission: RE | Admit: 2017-10-21 | Discharge: 2017-10-21 | Disposition: A | Discharge status: Home / Self Care | Payer: Medicaid Other | Source: Ambulatory Visit | Attending: Family | Admitting: Family

## 2017-10-21 VITALS — BP 153/90 | HR 90 | Temp 98.2°F | Resp 18 | Wt 159.0 lb

## 2017-10-21 DIAGNOSIS — R918 Other nonspecific abnormal finding of lung field: Secondary | ICD-10-CM | POA: Diagnosis not present

## 2017-10-21 DIAGNOSIS — Z9221 Personal history of antineoplastic chemotherapy: Secondary | ICD-10-CM | POA: Insufficient documentation

## 2017-10-21 DIAGNOSIS — Z85118 Personal history of other malignant neoplasm of bronchus and lung: Secondary | ICD-10-CM | POA: Insufficient documentation

## 2017-10-21 DIAGNOSIS — S22069A Unspecified fracture of T7-T8 vertebra, initial encounter for closed fracture: Secondary | ICD-10-CM | POA: Diagnosis not present

## 2017-10-21 DIAGNOSIS — C3491 Malignant neoplasm of unspecified part of right bronchus or lung: Secondary | ICD-10-CM

## 2017-10-21 DIAGNOSIS — Z923 Personal history of irradiation: Secondary | ICD-10-CM | POA: Diagnosis not present

## 2017-10-21 DIAGNOSIS — F17210 Nicotine dependence, cigarettes, uncomplicated: Secondary | ICD-10-CM | POA: Diagnosis not present

## 2017-10-21 DIAGNOSIS — X58XXXA Exposure to other specified factors, initial encounter: Secondary | ICD-10-CM | POA: Insufficient documentation

## 2017-10-21 LAB — CMP (CANCER CENTER ONLY)
ALT: 12 U/L (ref 10–47)
ANION GAP: 9 (ref 5–15)
AST: 22 U/L (ref 11–38)
Albumin: 3.4 g/dL — ABNORMAL LOW (ref 3.5–5.0)
Alkaline Phosphatase: 68 U/L (ref 26–84)
BILIRUBIN TOTAL: 0.8 mg/dL (ref 0.2–1.6)
BUN: 13 mg/dL (ref 7–22)
CHLORIDE: 100 mmol/L (ref 98–108)
CO2: 32 mmol/L (ref 18–33)
Calcium: 10.2 mg/dL (ref 8.0–10.3)
Creatinine: 1.1 mg/dL (ref 0.60–1.20)
Glucose, Bld: 76 mg/dL (ref 73–118)
Potassium: 4.5 mmol/L (ref 3.3–4.7)
Sodium: 141 mmol/L (ref 128–145)
Total Protein: 7.3 g/dL (ref 6.4–8.1)

## 2017-10-21 LAB — CBC WITH DIFFERENTIAL (CANCER CENTER ONLY)
Basophils Absolute: 0 10*3/uL (ref 0.0–0.1)
Basophils Relative: 0 %
Eosinophils Absolute: 0.2 10*3/uL (ref 0.0–0.5)
Eosinophils Relative: 3 %
HEMATOCRIT: 35 % (ref 34.8–46.6)
Hemoglobin: 11.9 g/dL (ref 11.6–15.9)
LYMPHS PCT: 21 %
Lymphs Abs: 1 10*3/uL (ref 0.9–3.3)
MCH: 29.2 pg (ref 26.0–34.0)
MCHC: 34 g/dL (ref 32.0–36.0)
MCV: 86 fL (ref 81.0–101.0)
MONO ABS: 0.4 10*3/uL (ref 0.1–0.9)
MONOS PCT: 7 %
NEUTROS ABS: 3.3 10*3/uL (ref 1.5–6.5)
Neutrophils Relative %: 69 %
Platelet Count: 189 10*3/uL (ref 145–400)
RBC: 4.07 MIL/uL (ref 3.70–5.32)
RDW: 14.6 % (ref 11.1–15.7)
WBC Count: 4.8 10*3/uL (ref 3.9–10.0)

## 2017-10-21 LAB — LACTATE DEHYDROGENASE: LDH: 154 U/L (ref 98–192)

## 2017-10-21 MED ORDER — HEPARIN SOD (PORK) LOCK FLUSH 100 UNIT/ML IV SOLN
500.0000 [IU] | Freq: Once | INTRAVENOUS | Status: AC
  Administered 2017-10-21: 500 [IU] via INTRAVENOUS
  Filled 2017-10-21: qty 5

## 2017-10-21 MED ORDER — IOPAMIDOL (ISOVUE-300) INJECTION 61%
100.0000 mL | Freq: Once | INTRAVENOUS | Status: AC | PRN
  Administered 2017-10-21: 100 mL via INTRAVENOUS

## 2017-10-21 MED ORDER — SODIUM CHLORIDE 0.9% FLUSH
10.0000 mL | INTRAVENOUS | Status: DC | PRN
  Administered 2017-10-21: 10 mL via INTRAVENOUS
  Filled 2017-10-21: qty 10

## 2017-10-21 NOTE — Patient Instructions (Signed)
Implanted Port Insertion, Care After °This sheet gives you information about how to care for yourself after your procedure. Your health care provider may also give you more specific instructions. If you have problems or questions, contact your health care provider. °What can I expect after the procedure? °After your procedure, it is common to have: °· Discomfort at the port insertion site. °· Bruising on the skin over the port. This should improve over 3-4 days. ° °Follow these instructions at home: °Port care °· After your port is placed, you will get a manufacturer's information card. The card has information about your port. Keep this card with you at all times. °· Take care of the port as told by your health care provider. Ask your health care provider if you or a family member can get training for taking care of the port at home. A home health care nurse may also take care of the port. °· Make sure to remember what type of port you have. °Incision care °· Follow instructions from your health care provider about how to take care of your port insertion site. Make sure you: °? Wash your hands with soap and water before you change your bandage (dressing). If soap and water are not available, use hand sanitizer. °? Change your dressing as told by your health care provider. °? Leave stitches (sutures), skin glue, or adhesive strips in place. These skin closures may need to stay in place for 2 weeks or longer. If adhesive strip edges start to loosen and curl up, you may trim the loose edges. Do not remove adhesive strips completely unless your health care provider tells you to do that. °· Check your port insertion site every day for signs of infection. Check for: °? More redness, swelling, or pain. °? More fluid or blood. °? Warmth. °? Pus or a bad smell. °General instructions °· Do not take baths, swim, or use a hot tub until your health care provider approves. °· Do not lift anything that is heavier than 10 lb (4.5  kg) for a week, or as told by your health care provider. °· Ask your health care provider when it is okay to: °? Return to work or school. °? Resume usual physical activities or sports. °· Do not drive for 24 hours if you were given a medicine to help you relax (sedative). °· Take over-the-counter and prescription medicines only as told by your health care provider. °· Wear a medical alert bracelet in case of an emergency. This will tell any health care providers that you have a port. °· Keep all follow-up visits as told by your health care provider. This is important. °Contact a health care provider if: °· You cannot flush your port with saline as directed, or you cannot draw blood from the port. °· You have a fever or chills. °· You have more redness, swelling, or pain around your port insertion site. °· You have more fluid or blood coming from your port insertion site. °· Your port insertion site feels warm to the touch. °· You have pus or a bad smell coming from the port insertion site. °Get help right away if: °· You have chest pain or shortness of breath. °· You have bleeding from your port that you cannot control. °Summary °· Take care of the port as told by your health care provider. °· Change your dressing as told by your health care provider. °· Keep all follow-up visits as told by your health care provider. °  This information is not intended to replace advice given to you by your health care provider. Make sure you discuss any questions you have with your health care provider. °Document Released: 12/20/2012 Document Revised: 01/21/2016 Document Reviewed: 01/21/2016 °Elsevier Interactive Patient Education © 2017 [employer] ° °

## 2017-10-21 NOTE — Progress Notes (Signed)
Hematology and Oncology Follow Up Visit  Rachael Lester [id number] [birth year] 62 y.o. 10/21/2017   Principle Diagnosis:  Limited stage small cell lung cancer of the right lung  Past Therapy: Status post chemotherapy and radiation therapy in [place] - completed in January 2018  Current Therapy:   Observation   Interim History:  Rachael Lester is here today for follow-up.  So far, everything is going pretty well for her.  She had a good summer.  She was up in [place] helping to take care of some of her grandchildren.  Her daughter is moving this weekend.  She is going to help her out.  She had a CT scan done today.  The CT scan did not show any obvious cancer recurrence.  She did have some increased scarring in the right lung.  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  She is having some back discomfort.  I offer her the opportunity to have kyphoplasty.  She will think about this.  She is still smoking about half pack of cigarettes a day.  She is trying to cut back.  She is had no obvious change in bowel or bladder habits.  She has had no bleeding.  She has had no visual issues.  Overall, her performance status is ECOG 0.     Medications:  Allergies as of 10/21/2017      Reactions   Delsym [dextromethorphan Polistirex Er] Hives, Itching      Medication List        Accurate as of 10/21/17 11:58 AM. Always use your most recent med list.          ALPRAZolam 0.25 MG tablet Commonly known as:  XANAX TAKE 1 TABLET BY MOUTH TWICE A DAY IF NEEDED FOR ANXIETY   aspirin EC 81 MG tablet Take 81 mg by mouth daily.   atorvastatin 20 MG tablet Commonly known as:  LIPITOR Take 20 mg by mouth at bedtime.   carvedilol 3.125 MG tablet Commonly known as:  COREG TAKE 1 TABLET BY MOUTH TWICE A DAY WITH MEALS   naloxegol oxalate 25 MG Tabs tablet Commonly known as:  MOVANTIK Take 1 tablet (25 mg total) by mouth daily.   oxyCODONE 5 MG immediate  release tablet Commonly known as:  Oxy IR/ROXICODONE take 1 tablet by mouth every 4 hours if needed for severe pain   pantoprazole 40 MG tablet Commonly known as:  PROTONIX Take 1 tablet (40 mg total) by mouth daily.   Vitamin D (Ergocalciferol) 50000 units Caps capsule Commonly known as:  DRISDOL Take 50,000 Units by mouth once a week.       Allergies:  Allergies  Allergen Reactions  . Delsym [Dextromethorphan Polistirex Er] Hives and Itching    Past Medical History, Surgical history, Social history, and Family History were reviewed and updated.  Review of Systems: Review of Systems  Constitutional: Negative.   HENT: Negative.   Eyes: Negative.   Respiratory: Negative.   Cardiovascular: Negative.   Gastrointestinal: Negative.   Genitourinary: Negative.   Musculoskeletal: Negative.   Skin: Negative.   Neurological: Negative.   Endo/Heme/Allergies: Negative.   Psychiatric/Behavioral: Negative.      Physical Exam:  weight is 159 lb (72.1 kg). Her oral temperature is 98.2 F (36.8 C). Her blood pressure is 153/90 (abnormal) and her pulse is 90. Her respiration is 18 and oxygen saturation is 99%.   Wt Readings from Last 3 Encounters:  10/21/17 159 lb (72.1  kg)  08/26/17 160 lb (72.6 kg)  04/20/17 158 lb 3.2 oz (71.8 kg)    Physical Exam  Constitutional: She is oriented to person, place, and time.  HENT:  Head: Normocephalic and atraumatic.  Mouth/Throat: Oropharynx is clear and moist.  Eyes: Pupils are equal, round, and reactive to light. EOM are normal.  Neck: Normal range of motion.  Cardiovascular: Normal rate, regular rhythm and normal heart sounds.  Pulmonary/Chest: Effort normal and breath sounds normal.  Abdominal: Soft. Bowel sounds are normal.  Musculoskeletal: Normal range of motion. She exhibits no edema, tenderness or deformity.  Lymphadenopathy:    She has no cervical adenopathy.  Neurological: She is alert and oriented to person, place, and  time.  Skin: Skin is warm and dry. No rash noted. No erythema.  Psychiatric: She has a normal mood and affect. Her behavior is normal. Judgment and thought content normal.  Vitals reviewed.    Lab Results  Component Value Date   WBC 4.8 10/21/2017   HGB 11.9 10/21/2017   HCT 35.0 10/21/2017   MCV 86.0 10/21/2017   PLT 189 10/21/2017   No results found for: FERRITIN, IRON, TIBC, UIBC, IRONPCTSAT Lab Results  Component Value Date   RBC 4.07 10/21/2017   No results found for: KPAFRELGTCHN, LAMBDASER, KAPLAMBRATIO No results found for: IGGSERUM, IGA, IGMSERUM No results found for: Odetta Pink, SPEI   Chemistry      Component Value Date/Time   NA 141 10/21/2017 0851   NA 141 02/17/2017 1100   K 4.5 10/21/2017 0851   K 4.4 02/17/2017 1100   CL 100 10/21/2017 0851   CL 100 02/17/2017 1100   CO2 32 10/21/2017 0851   CO2 32 02/17/2017 1100   BUN 13 10/21/2017 0851   BUN 18 02/17/2017 1100   CREATININE 1.10 10/21/2017 0851   CREATININE 1.5 (H) 02/17/2017 1100      Component Value Date/Time   CALCIUM 10.2 10/21/2017 0851   CALCIUM 10.0 02/17/2017 1100   ALKPHOS 68 10/21/2017 0851   ALKPHOS 65 02/17/2017 1100   AST 22 10/21/2017 0851   ALT 12 10/21/2017 0851   ALT 15 02/17/2017 1100   BILITOT 0.8 10/21/2017 0851      Impression and Plan: Rachael Lester is a very pleasant 62 yo African American female with history of limited stage small cell lung cancer. She was treated very aggressively while living in [place] with her daughter. She received both chemo and radiation.   We still need to monitor the small cell lung cancer for recurrence.  We will see about doing a CT scan in December.   I would like to see her back in about 3 months.  She still has the Port-A-Cath in.  I think we should keep the Port-A-Cath in for right now.   Volanda Napoleon, MD 8/9/201911:58 AM

## 2018-01-20 ENCOUNTER — Encounter: Payer: Self-pay | Admitting: Hematology & Oncology

## 2018-01-20 ENCOUNTER — Inpatient Hospital Stay: Payer: Medicaid Other | Attending: Family | Admitting: Hematology & Oncology

## 2018-01-20 ENCOUNTER — Other Ambulatory Visit: Payer: Self-pay

## 2018-01-20 ENCOUNTER — Inpatient Hospital Stay: Payer: Medicaid Other

## 2018-01-20 DIAGNOSIS — Z85118 Personal history of other malignant neoplasm of bronchus and lung: Secondary | ICD-10-CM

## 2018-01-20 DIAGNOSIS — M8440XA Pathological fracture, unspecified site, initial encounter for fracture: Secondary | ICD-10-CM

## 2018-01-20 DIAGNOSIS — C3491 Malignant neoplasm of unspecified part of right bronchus or lung: Secondary | ICD-10-CM

## 2018-01-20 LAB — CMP (CANCER CENTER ONLY)
ALBUMIN: 3.6 g/dL (ref 3.5–5.0)
ALK PHOS: 61 U/L (ref 26–84)
ALT: 12 U/L (ref 10–47)
AST: 24 U/L (ref 11–38)
Anion gap: 10 (ref 5–15)
BUN: 18 mg/dL (ref 7–22)
CO2: 28 mmol/L (ref 18–33)
CREATININE: 1.1 mg/dL (ref 0.60–1.20)
Calcium: 9.8 mg/dL (ref 8.0–10.3)
Chloride: 103 mmol/L (ref 98–108)
GLUCOSE: 77 mg/dL (ref 73–118)
Potassium: 4 mmol/L (ref 3.3–4.7)
SODIUM: 141 mmol/L (ref 128–145)
Total Bilirubin: 0.7 mg/dL (ref 0.2–1.6)
Total Protein: 7.4 g/dL (ref 6.4–8.1)

## 2018-01-20 LAB — CBC WITH DIFFERENTIAL (CANCER CENTER ONLY)
ABS IMMATURE GRANULOCYTES: 0.01 10*3/uL (ref 0.00–0.07)
Basophils Absolute: 0.1 10*3/uL (ref 0.0–0.1)
Basophils Relative: 1 %
EOS PCT: 3 %
Eosinophils Absolute: 0.1 10*3/uL (ref 0.0–0.5)
HEMATOCRIT: 36.1 % (ref 36.0–46.0)
Hemoglobin: 11.7 g/dL — ABNORMAL LOW (ref 12.0–15.0)
Immature Granulocytes: 0 %
LYMPHS PCT: 26 %
Lymphs Abs: 1.3 10*3/uL (ref 0.7–4.0)
MCH: 28 pg (ref 26.0–34.0)
MCHC: 32.4 g/dL (ref 30.0–36.0)
MCV: 86.4 fL (ref 80.0–100.0)
MONO ABS: 0.4 10*3/uL (ref 0.1–1.0)
MONOS PCT: 7 %
Neutro Abs: 3.3 10*3/uL (ref 1.7–7.7)
Neutrophils Relative %: 63 %
Platelet Count: 199 10*3/uL (ref 150–400)
RBC: 4.18 MIL/uL (ref 3.87–5.11)
RDW: 14.5 % (ref 11.5–15.5)
WBC: 5.2 10*3/uL (ref 4.0–10.5)
nRBC: 0 % (ref 0.0–0.2)

## 2018-01-20 LAB — LACTATE DEHYDROGENASE: LDH: 131 U/L (ref 98–192)

## 2018-01-20 NOTE — Patient Instructions (Signed)
Implanted Port Insertion, Care After °This sheet gives you information about how to care for yourself after your procedure. Your health care provider may also give you more specific instructions. If you have problems or questions, contact your health care provider. °What can I expect after the procedure? °After your procedure, it is common to have: °· Discomfort at the port insertion site. °· Bruising on the skin over the port. This should improve over 3-4 days. ° °Follow these instructions at home: °Port care °· After your port is placed, you will get a manufacturer's information card. The card has information about your port. Keep this card with you at all times. °· Take care of the port as told by your health care provider. Ask your health care provider if you or a family member can get training for taking care of the port at home. A home health care nurse may also take care of the port. °· Make sure to remember what type of port you have. °Incision care °· Follow instructions from your health care provider about how to take care of your port insertion site. Make sure you: °? Wash your hands with soap and water before you change your bandage (dressing). If soap and water are not available, use hand sanitizer. °? Change your dressing as told by your health care provider. °? Leave stitches (sutures), skin glue, or adhesive strips in place. These skin closures may need to stay in place for 2 weeks or longer. If adhesive strip edges start to loosen and curl up, you may trim the loose edges. Do not remove adhesive strips completely unless your health care provider tells you to do that. °· Check your port insertion site every day for signs of infection. Check for: °? More redness, swelling, or pain. °? More fluid or blood. °? Warmth. °? Pus or a bad smell. °General instructions °· Do not take baths, swim, or use a hot tub until your health care provider approves. °· Do not lift anything that is heavier than 10 lb (4.5  kg) for a week, or as told by your health care provider. °· Ask your health care provider when it is okay to: °? Return to work or school. °? Resume usual physical activities or sports. °· Do not drive for 24 hours if you were given a medicine to help you relax (sedative). °· Take over-the-counter and prescription medicines only as told by your health care provider. °· Wear a medical alert bracelet in case of an emergency. This will tell any health care providers that you have a port. °· Keep all follow-up visits as told by your health care provider. This is important. °Contact a health care provider if: °· You cannot flush your port with saline as directed, or you cannot draw blood from the port. °· You have a fever or chills. °· You have more redness, swelling, or pain around your port insertion site. °· You have more fluid or blood coming from your port insertion site. °· Your port insertion site feels warm to the touch. °· You have pus or a bad smell coming from the port insertion site. °Get help right away if: °· You have chest pain or shortness of breath. °· You have bleeding from your port that you cannot control. °Summary °· Take care of the port as told by your health care provider. °· Change your dressing as told by your health care provider. °· Keep all follow-up visits as told by your health care provider. °  This information is not intended to replace advice given to you by your health care provider. Make sure you discuss any questions you have with your health care provider. °Document Released: 12/20/2012 Document Revised: 01/21/2016 Document Reviewed: 01/21/2016 °Elsevier Interactive Patient Education © 2017 [employer] ° °

## 2018-01-20 NOTE — Progress Notes (Signed)
Hematology and Oncology Follow Up Visit  Rachael Lester [id number] 08-06-1955 62 y.o. 01/20/2018   Principle Diagnosis:  Limited stage small cell lung cancer of the right lung  Past Therapy: Status post chemotherapy and radiation therapy in [place] - completed in January 2018  Current Therapy:   Observation   Interim History:  Rachael Lester is here today for follow-up.  So far, everything is going pretty well for her.  She had a good summer.  She was up in [place] helping to take care of some of her grandchildren.  Her daughter is moving this weekend.  She is going to help her out.  She had a CT scan done today.  The CT scan did not show any obvious cancer recurrence.  She did have some increased scarring in the right lung.  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  She is having some back discomfort.  I offer her the opportunity to have kyphoplasty.  She will think about this.  She is still smoking about half pack of cigarettes a day.  She is trying to cut back.  She is had no obvious change in bowel or bladder habits.  She has had no bleeding.  She has had no visual issues.  Overall, her performance status is ECOG 0.     Medications:  Allergies as of 01/20/2018      Reactions   Delsym [dextromethorphan Polistirex Er] Hives, Itching      Medication List        Accurate as of 01/20/18  1:32 PM. Always use your most recent med list.          ALPRAZolam 0.25 MG tablet Commonly known as:  XANAX TAKE 1 TABLET BY MOUTH TWICE A DAY IF NEEDED FOR ANXIETY   aspirin EC 81 MG tablet Take 81 mg by mouth daily.   atorvastatin 20 MG tablet Commonly known as:  LIPITOR Take 20 mg by mouth at bedtime.   carvedilol 3.125 MG tablet Commonly known as:  COREG TAKE 1 TABLET BY MOUTH TWICE A DAY WITH MEALS   oxyCODONE 5 MG immediate release tablet Commonly known as:  Oxy IR/ROXICODONE take 1 tablet by mouth every 4 hours if needed for severe  pain   pantoprazole 40 MG tablet Commonly known as:  PROTONIX Take 1 tablet (40 mg total) by mouth daily.       Allergies:  Allergies  Allergen Reactions  . Delsym [Dextromethorphan Polistirex Er] Hives and Itching    Past Medical History, Surgical history, Social history, and Family History were reviewed and updated.  Review of Systems: Review of Systems  Constitutional: Negative.   HENT: Negative.   Eyes: Negative.   Respiratory: Negative.   Cardiovascular: Negative.   Gastrointestinal: Negative.   Genitourinary: Negative.   Musculoskeletal: Negative.   Skin: Negative.   Neurological: Negative.   Endo/Heme/Allergies: Negative.   Psychiatric/Behavioral: Negative.      Physical Exam:  weight is 159 lb (72.1 kg). Her oral temperature is 99.4 F (37.4 C). Her blood pressure is 138/91 (abnormal) and her pulse is 89. Her respiration is 18 and oxygen saturation is 98%.   Wt Readings from Last 3 Encounters:  01/20/18 159 lb (72.1 kg)  10/21/17 159 lb (72.1 kg)  08/26/17 160 lb (72.6 kg)    Physical Exam  Constitutional: She is oriented to person, place, and time.  HENT:  Head: Normocephalic and atraumatic.  Mouth/Throat: Oropharynx is clear and moist.  Eyes: Pupils are equal, round, and reactive to light. EOM are normal.  Neck: Normal range of motion.  Cardiovascular: Normal rate, regular rhythm and normal heart sounds.  Pulmonary/Chest: Effort normal and breath sounds normal.  Abdominal: Soft. Bowel sounds are normal.  Musculoskeletal: Normal range of motion. She exhibits no edema, tenderness or deformity.  Lymphadenopathy:    She has no cervical adenopathy.  Neurological: She is alert and oriented to person, place, and time.  Skin: Skin is warm and dry. No rash noted. No erythema.  Psychiatric: She has a normal mood and affect. Her behavior is normal. Judgment and thought content normal.  Vitals reviewed.    Lab Results  Component Value Date   WBC 5.2  01/20/2018   HGB 11.7 (L) 01/20/2018   HCT 36.1 01/20/2018   MCV 86.4 01/20/2018   PLT 199 01/20/2018   No results found for: FERRITIN, IRON, TIBC, UIBC, IRONPCTSAT Lab Results  Component Value Date   RBC 4.18 01/20/2018   No results found for: KPAFRELGTCHN, LAMBDASER, KAPLAMBRATIO No results found for: IGGSERUM, IGA, IGMSERUM No results found for: Odetta Pink, SPEI   Chemistry      Component Value Date/Time   NA 141 01/20/2018 1127   NA 141 02/17/2017 1100   K 4.0 01/20/2018 1127   K 4.4 02/17/2017 1100   CL 103 01/20/2018 1127   CL 100 02/17/2017 1100   CO2 28 01/20/2018 1127   CO2 32 02/17/2017 1100   BUN 18 01/20/2018 1127   BUN 18 02/17/2017 1100   CREATININE 1.10 01/20/2018 1127   CREATININE 1.5 (H) 02/17/2017 1100      Component Value Date/Time   CALCIUM 9.8 01/20/2018 1127   CALCIUM 10.0 02/17/2017 1100   ALKPHOS 61 01/20/2018 1127   ALKPHOS 65 02/17/2017 1100   AST 24 01/20/2018 1127   ALT 12 01/20/2018 1127   ALT 15 02/17/2017 1100   BILITOT 0.7 01/20/2018 1127      Impression and Plan: Rachael Lester is a very pleasant 62 yo African American female with history of limited stage small cell lung cancer. She was treated very aggressively while living in [place] with her daughter. She received both chemo and radiation.   We still need to monitor the small cell lung cancer for recurrence.  We will see about doing a CT scan in December.   I would like to see her back in about 3 months.  She still has the Port-A-Cath in.  I think we should keep the Port-A-Cath in for right now.   Volanda Napoleon, MD 11/8/20191:32 PM

## 2018-02-23 ENCOUNTER — Other Ambulatory Visit: Payer: Self-pay | Admitting: Family

## 2018-02-23 DIAGNOSIS — C3491 Malignant neoplasm of unspecified part of right bronchus or lung: Secondary | ICD-10-CM

## 2018-02-23 DIAGNOSIS — M8450XA Pathological fracture in neoplastic disease, unspecified site, initial encounter for fracture: Secondary | ICD-10-CM

## 2018-02-24 ENCOUNTER — Ambulatory Visit (HOSPITAL_BASED_OUTPATIENT_CLINIC_OR_DEPARTMENT_OTHER): Payer: Medicaid Other

## 2018-03-03 ENCOUNTER — Encounter (HOSPITAL_BASED_OUTPATIENT_CLINIC_OR_DEPARTMENT_OTHER): Payer: Self-pay

## 2018-03-03 ENCOUNTER — Telehealth: Payer: Self-pay | Admitting: *Deleted

## 2018-03-03 ENCOUNTER — Ambulatory Visit (HOSPITAL_BASED_OUTPATIENT_CLINIC_OR_DEPARTMENT_OTHER)
Admission: RE | Admit: 2018-03-03 | Discharge: 2018-03-03 | Disposition: A | Discharge status: Home / Self Care | Payer: Medicaid Other | Source: Ambulatory Visit | Attending: Hematology & Oncology | Admitting: Hematology & Oncology

## 2018-03-03 DIAGNOSIS — M8440XA Pathological fracture, unspecified site, initial encounter for fracture: Secondary | ICD-10-CM | POA: Diagnosis not present

## 2018-03-03 MED ORDER — IOPAMIDOL (ISOVUE-300) INJECTION 61%
100.0000 mL | Freq: Once | INTRAVENOUS | Status: AC | PRN
  Administered 2018-03-03: 100 mL via INTRAVENOUS

## 2018-03-03 MED FILL — PANTOPRAZOLE SOD DR 40 MG T: 40 | 90 days supply | Qty: 90 | Fill #0

## 2018-03-03 MED FILL — CARVEDILOL 3.125 MG TABLET: 3.125 | 30 days supply | Qty: 60 | Fill #0

## 2018-03-03 NOTE — Telephone Encounter (Addendum)
Patient is aware of results  ----- Message from Volanda Napoleon, MD sent at 03/03/2018  2:25 PM EST ----- Call - NO cancer on the CT scan.  pete

## 2018-03-06 ENCOUNTER — Other Ambulatory Visit: Payer: Self-pay | Admitting: Family

## 2018-03-23 ENCOUNTER — Other Ambulatory Visit: Payer: Self-pay | Admitting: *Deleted

## 2018-03-23 DIAGNOSIS — C3491 Malignant neoplasm of unspecified part of right bronchus or lung: Secondary | ICD-10-CM

## 2018-03-24 ENCOUNTER — Other Ambulatory Visit: Payer: Medicaid Other

## 2018-03-24 ENCOUNTER — Ambulatory Visit: Payer: Medicaid Other | Admitting: Hematology & Oncology

## 2018-04-03 ENCOUNTER — Inpatient Hospital Stay (HOSPITAL_BASED_OUTPATIENT_CLINIC_OR_DEPARTMENT_OTHER): Payer: Medicaid Other | Admitting: Family

## 2018-04-03 ENCOUNTER — Inpatient Hospital Stay: Payer: Medicaid Other

## 2018-04-03 ENCOUNTER — Inpatient Hospital Stay: Payer: Medicaid Other | Attending: Family

## 2018-04-03 VITALS — BP 95/81 | HR 119 | Temp 97.6°F | Resp 18 | Wt 158.5 lb

## 2018-04-03 DIAGNOSIS — Z7982 Long term (current) use of aspirin: Secondary | ICD-10-CM | POA: Diagnosis not present

## 2018-04-03 DIAGNOSIS — Z9221 Personal history of antineoplastic chemotherapy: Secondary | ICD-10-CM

## 2018-04-03 DIAGNOSIS — S22069A Unspecified fracture of T7-T8 vertebra, initial encounter for closed fracture: Secondary | ICD-10-CM

## 2018-04-03 DIAGNOSIS — C3491 Malignant neoplasm of unspecified part of right bronchus or lung: Secondary | ICD-10-CM | POA: Diagnosis not present

## 2018-04-03 DIAGNOSIS — Z79899 Other long term (current) drug therapy: Secondary | ICD-10-CM | POA: Insufficient documentation

## 2018-04-03 DIAGNOSIS — Z923 Personal history of irradiation: Secondary | ICD-10-CM | POA: Diagnosis not present

## 2018-04-03 DIAGNOSIS — Z95828 Presence of other vascular implants and grafts: Secondary | ICD-10-CM

## 2018-04-03 LAB — CBC WITH DIFFERENTIAL (CANCER CENTER ONLY)
Abs Immature Granulocytes: 0.02 10*3/uL (ref 0.00–0.07)
Basophils Absolute: 0 10*3/uL (ref 0.0–0.1)
Basophils Relative: 1 %
EOS PCT: 2 %
Eosinophils Absolute: 0.1 10*3/uL (ref 0.0–0.5)
HCT: 42.4 % (ref 36.0–46.0)
HEMOGLOBIN: 14.2 g/dL (ref 12.0–15.0)
Immature Granulocytes: 1 %
LYMPHS PCT: 35 %
Lymphs Abs: 1.4 10*3/uL (ref 0.7–4.0)
MCH: 29.1 pg (ref 26.0–34.0)
MCHC: 33.5 g/dL (ref 30.0–36.0)
MCV: 86.9 fL (ref 80.0–100.0)
Monocytes Absolute: 0.6 10*3/uL (ref 0.1–1.0)
Monocytes Relative: 14 %
Neutro Abs: 2 10*3/uL (ref 1.7–7.7)
Neutrophils Relative %: 47 %
Platelet Count: 206 10*3/uL (ref 150–400)
RBC: 4.88 MIL/uL (ref 3.87–5.11)
RDW: 14.3 % (ref 11.5–15.5)
WBC Count: 4.1 10*3/uL (ref 4.0–10.5)
nRBC: 0 % (ref 0.0–0.2)

## 2018-04-03 LAB — CMP (CANCER CENTER ONLY)
ALT: 7 U/L (ref 0–44)
AST: 17 U/L (ref 15–41)
Albumin: 4.5 g/dL (ref 3.5–5.0)
Alkaline Phosphatase: 85 U/L (ref 38–126)
Anion gap: 7 (ref 5–15)
BUN: 18 mg/dL (ref 6–20)
CO2: 30 mmol/L (ref 22–32)
Calcium: 10.7 mg/dL — ABNORMAL HIGH (ref 8.9–10.3)
Chloride: 97 mmol/L — ABNORMAL LOW (ref 98–111)
Creatinine: 1.27 mg/dL — ABNORMAL HIGH (ref 0.44–1.00)
GFR, Est AFR Am: 53 mL/min — ABNORMAL LOW (ref >60)
GFR, Estimated: 46 mL/min — ABNORMAL LOW (ref >60)
GLUCOSE: 103 mg/dL — AB (ref 70–99)
Potassium: 3.9 mmol/L (ref 3.5–5.1)
Sodium: 134 mmol/L — ABNORMAL LOW (ref 135–145)
Total Bilirubin: 0.8 mg/dL (ref 0.3–1.2)
Total Protein: 7.9 g/dL (ref 6.5–8.1)

## 2018-04-03 MED ORDER — SODIUM CHLORIDE 0.9% FLUSH
10.0000 mL | INTRAVENOUS | Status: DC | PRN
  Administered 2018-04-03: 10 mL via INTRAVENOUS
  Filled 2018-04-03: qty 10

## 2018-04-03 MED ORDER — HEPARIN SOD (PORK) LOCK FLUSH 100 UNIT/ML IV SOLN
500.0000 [IU] | Freq: Once | INTRAVENOUS | Status: AC
  Administered 2018-04-03: 500 [IU] via INTRAVENOUS
  Filled 2018-04-03: qty 5

## 2018-04-03 MED ORDER — OXYCODONE HCL 5 MG PO TABS: 5.0000 mg | ORAL_TABLET | Freq: Four times a day (QID) | ORAL | 0 refills | Status: DC | PRN

## 2018-04-03 MED FILL — oxyCODONE HCL 5 MG TABS: 5 | 15 days supply | Qty: 60 | Fill #0

## 2018-04-03 NOTE — Progress Notes (Signed)
Hematology and Oncology Follow Up Visit  Rachael Lester [id number] Oct 26, 1955 63 y.o. 04/03/2018   Principle Diagnosis:  Limited stage small cell lung cancer of the right lung  Past Therapy: Status post chemotherapy and radiation therapy in [place] - completed in January 2018  Current Therapy:   Observation   Interim History:  Rachael Lester is here today for follow-up. She states that she is still having constant back pain and would like to move forward with the orthopedic surgery referral she had spoke to Rachael Lester about at her last visit. She wanted to get past through the holiday season before she had any type of surgical procedure.  CT scans in December showed no evidence of recurrence.  She has sinus congestion with cough and white/yellow phlegm at times. Lung sounds are clear throughout at this time. She is taking Mucinex daily which she feels is helping.  She has had chills and sweats but denies fever.  No n/v, rash, dizziness, SOB, chest pain, palpitations, abdominal pain or changes in bowel or bladder habits.  No swelling in her extremities at this time. The numbness and tingling in her feet is stable and unchanged.  No falls or syncopal episodes.  No lymphadenopathy noted on exam.  No bleeding, bruising or petechiae.  Her appetite comes and goes and she states that she needs to better hydrate at home. Her weight is stable.   ECOG Performance Status: 0 - Asymptomatic  Medications:  Allergies as of 04/03/2018      Reactions   Delsym [dextromethorphan Polistirex Er] Hives, Itching      Medication List       Accurate as of April 03, 2018 11:04 AM. Always use your most recent med list.        ALPRAZolam 0.25 MG tablet Commonly known as:  XANAX TAKE 1 TABLET BY MOUTH TWICE A DAY IF NEEDED FOR ANXIETY   aspirin EC 81 MG tablet Take 81 mg by mouth daily.   atorvastatin 20 MG tablet Commonly known as:  LIPITOR Take 20 mg by mouth at bedtime.     carvedilol 3.125 MG tablet Commonly known as:  COREG TAKE 1 TABLET BY MOUTH TWICE A DAY WITH MEALS   oxyCODONE 5 MG immediate release tablet Commonly known as:  Oxy IR/ROXICODONE take 1 tablet by mouth every 4 hours if needed for severe pain   pantoprazole 40 MG tablet Commonly known as:  PROTONIX Take 1 tablet (40 mg total) by mouth daily.       Allergies:  Allergies  Allergen Reactions  . Delsym [Dextromethorphan Polistirex Er] Hives and Itching    Past Medical History, Surgical history, Social history, and Family History were reviewed and updated.  Review of Systems: All other 10 point review of systems is negative.   Physical Exam:  vitals were not taken for this visit.   Wt Readings from Last 3 Encounters:  01/20/18 159 lb (72.1 kg)  10/21/17 159 lb (72.1 kg)  08/26/17 160 lb (72.6 kg)    Ocular: Sclerae unicteric, pupils equal, round and reactive to light Ear-nose-throat: Oropharynx clear, dentition fair Lymphatic: No cervical, supraclavicular or axillary adenopathy Lungs no rales or rhonchi, good excursion bilaterally Heart regular rate and rhythm, no murmur appreciated Abd soft, nontender, positive bowel sounds, no liver or spleen tip palpated on exam, no fluid wave  MSK no focal spinal tenderness, no joint edema Neuro: non-focal, well-oriented, appropriate affect Breasts: Deferred   Lab Results  Component Value Date   WBC  5.2 01/20/2018   HGB 11.7 (L) 01/20/2018   HCT 36.1 01/20/2018   MCV 86.4 01/20/2018   PLT 199 01/20/2018   No results found for: FERRITIN, IRON, TIBC, UIBC, IRONPCTSAT Lab Results  Component Value Date   RBC 4.18 01/20/2018   No results found for: KPAFRELGTCHN, LAMBDASER, KAPLAMBRATIO No results found for: IGGSERUM, IGA, IGMSERUM No results found for: Odetta Pink, SPEI   Chemistry      Component Value Date/Time   NA 141 01/20/2018 1127   NA 141 02/17/2017 1100   K 4.0  01/20/2018 1127   K 4.4 02/17/2017 1100   CL 103 01/20/2018 1127   CL 100 02/17/2017 1100   CO2 28 01/20/2018 1127   CO2 32 02/17/2017 1100   BUN 18 01/20/2018 1127   BUN 18 02/17/2017 1100   CREATININE 1.10 01/20/2018 1127   CREATININE 1.5 (H) 02/17/2017 1100      Component Value Date/Time   CALCIUM 9.8 01/20/2018 1127   CALCIUM 10.0 02/17/2017 1100   ALKPHOS 61 01/20/2018 1127   ALKPHOS 65 02/17/2017 1100   AST 24 01/20/2018 1127   ALT 12 01/20/2018 1127   ALT 15 02/17/2017 1100   BILITOT 0.7 01/20/2018 1127       Impression and Plan: Rachael Lester is a very pleasant 63 yo African American female with history of limited stage small cell lung cancer. She was treated very aggressively with chemo and radiation while living in [place] with her daughter.  She is doing fairly well but is still having continuous back pain due to the T8 fracture. Referral was put in or orthopedic surgery Rachael Lester.  MD visit in 3 months, will place orders at that time for follow-up scans.  She will contact our office with any questions or concerns. We can certainly see her sooner if need be.   Rachael Peace, NP 1/20/202011:04 AM

## 2018-05-15 ENCOUNTER — Other Ambulatory Visit: Payer: Self-pay

## 2018-05-15 ENCOUNTER — Inpatient Hospital Stay: Payer: Medicaid Other | Attending: Family

## 2018-05-15 VITALS — BP 132/87 | HR 90 | Temp 98.7°F | Resp 18

## 2018-05-15 DIAGNOSIS — C3491 Malignant neoplasm of unspecified part of right bronchus or lung: Secondary | ICD-10-CM | POA: Insufficient documentation

## 2018-05-15 DIAGNOSIS — Z452 Encounter for adjustment and management of vascular access device: Secondary | ICD-10-CM | POA: Insufficient documentation

## 2018-05-15 MED ORDER — HEPARIN SOD (PORK) LOCK FLUSH 100 UNIT/ML IV SOLN
500.0000 [IU] | Freq: Once | INTRAVENOUS | Status: AC
  Administered 2018-05-15: 500 [IU] via INTRAVENOUS
  Filled 2018-05-15: qty 5

## 2018-05-15 MED ORDER — SODIUM CHLORIDE 0.9% FLUSH
10.0000 mL | INTRAVENOUS | Status: DC | PRN
  Administered 2018-05-15: 10 mL via INTRAVENOUS
  Filled 2018-05-15: qty 10

## 2018-05-15 NOTE — Patient Instructions (Signed)

## 2018-06-16 ENCOUNTER — Other Ambulatory Visit: Payer: Self-pay | Admitting: Family

## 2018-06-16 ENCOUNTER — Inpatient Hospital Stay: Payer: Medicare Other | Attending: Family

## 2018-06-16 ENCOUNTER — Other Ambulatory Visit: Payer: Self-pay

## 2018-06-16 ENCOUNTER — Encounter: Payer: Self-pay | Admitting: Hematology & Oncology

## 2018-06-16 ENCOUNTER — Inpatient Hospital Stay: Payer: Medicare Other

## 2018-06-16 ENCOUNTER — Inpatient Hospital Stay (HOSPITAL_BASED_OUTPATIENT_CLINIC_OR_DEPARTMENT_OTHER): Payer: Medicare Other | Admitting: Hematology & Oncology

## 2018-06-16 VITALS — BP 123/88 | HR 89 | Temp 98.5°F | Resp 18 | Wt 164.0 lb

## 2018-06-16 DIAGNOSIS — Z85118 Personal history of other malignant neoplasm of bronchus and lung: Secondary | ICD-10-CM

## 2018-06-16 DIAGNOSIS — S22069A Unspecified fracture of T7-T8 vertebra, initial encounter for closed fracture: Secondary | ICD-10-CM

## 2018-06-16 DIAGNOSIS — C3491 Malignant neoplasm of unspecified part of right bronchus or lung: Secondary | ICD-10-CM

## 2018-06-16 LAB — CMP (CANCER CENTER ONLY)
ALT: 13 U/L (ref 0–44)
AST: 19 U/L (ref 15–41)
Albumin: 4.1 g/dL (ref 3.5–5.0)
Alkaline Phosphatase: 74 U/L (ref 38–126)
Anion gap: 5 (ref 5–15)
BUN: 16 mg/dL (ref 6–20)
CO2: 32 mmol/L (ref 22–32)
Calcium: 10 mg/dL (ref 8.9–10.3)
Chloride: 102 mmol/L (ref 98–111)
Creatinine: 1.29 mg/dL — ABNORMAL HIGH (ref 0.44–1.00)
GFR, Est AFR Am: 52 mL/min — ABNORMAL LOW (ref >60)
GFR, Estimated: 45 mL/min — ABNORMAL LOW (ref >60)
Glucose, Bld: 99 mg/dL (ref 70–99)
Potassium: 5 mmol/L (ref 3.5–5.1)
Sodium: 139 mmol/L (ref 135–145)
Total Bilirubin: 0.8 mg/dL (ref 0.3–1.2)
Total Protein: 7.5 g/dL (ref 6.5–8.1)

## 2018-06-16 LAB — CBC WITH DIFFERENTIAL (CANCER CENTER ONLY)
Abs Immature Granulocytes: 0.01 10*3/uL (ref 0.00–0.07)
Basophils Absolute: 0 10*3/uL (ref 0.0–0.1)
Basophils Relative: 1 %
Eosinophils Absolute: 0.3 10*3/uL (ref 0.0–0.5)
Eosinophils Relative: 5 %
HCT: 41.1 % (ref 36.0–46.0)
Hemoglobin: 13.5 g/dL (ref 12.0–15.0)
Immature Granulocytes: 0 %
Lymphocytes Relative: 28 %
Lymphs Abs: 1.3 10*3/uL (ref 0.7–4.0)
MCH: 29.5 pg (ref 26.0–34.0)
MCHC: 32.8 g/dL (ref 30.0–36.0)
MCV: 89.7 fL (ref 80.0–100.0)
Monocytes Absolute: 0.5 10*3/uL (ref 0.1–1.0)
Monocytes Relative: 10 %
Neutro Abs: 2.6 10*3/uL (ref 1.7–7.7)
Neutrophils Relative %: 56 %
Platelet Count: 183 10*3/uL (ref 150–400)
RBC: 4.58 MIL/uL (ref 3.87–5.11)
RDW: 15.4 % (ref 11.5–15.5)
WBC Count: 4.6 10*3/uL (ref 4.0–10.5)
nRBC: 0 % (ref 0.0–0.2)

## 2018-06-16 LAB — LACTATE DEHYDROGENASE: LDH: 140 U/L (ref 98–192)

## 2018-06-16 MED FILL — oxyCODONE HCL 5 MG TABS: 5 | 15 days supply | Qty: 60 | Fill #0

## 2018-06-16 NOTE — Progress Notes (Signed)
Hematology and Oncology Follow Up Visit  Rachael Lester [id number] [birth year] 63 y.o. 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  Several, she is doing pretty well.  Unfortunately, she is not had her back taking care of.  Every time she is post to see Dr. Lynann Bologna, there was a illness in the family.  She had to go away to help out.  She hopefully will be able to have Dr. Lynann Bologna see her soon again.  Hopefully he will be able to help the back with this compression fracture.    She is still smoking a couple cigarettes a day.  She has a large sebaceous cyst on the left side of her neck.  This does not cause her any problems.  I told her if she has issues with this, dermatology or even surgery would be able to take this out.  She has had no cough.  There is no shortness of breath.  She has had no chest wall pain.  There is been no change in bowel or bladder habits.  She is had no bleeding.  There is been no leg swelling.  She has had no headache.  Her appetite is okay.  Overall, her performance status is ECOG 0.    Medications:  Allergies as of 06/16/2018      Reactions   Delsym [dextromethorphan Polistirex Er] Hives, Itching      Medication List       Accurate as of June 16, 2018 10:05 AM. Always use your most recent med list.        ALPRAZolam 0.25 MG tablet Commonly known as:  XANAX TAKE 1 TABLET BY MOUTH TWICE A DAY IF NEEDED FOR ANXIETY   aspirin EC 81 MG tablet Take 81 mg by mouth daily.   atorvastatin 20 MG tablet Commonly known as:  LIPITOR Take 20 mg by mouth at bedtime.   carvedilol 3.125 MG tablet Commonly known as:  COREG TAKE 1 TABLET BY MOUTH TWICE A DAY WITH MEALS   oxyCODONE 5 MG  immediate release tablet Commonly known as:  Oxy IR/ROXICODONE Take 1 tablet (5 mg total) by mouth every 6 (six) hours as needed for severe pain.   pantoprazole 40 MG tablet Commonly known as:  Protonix Take 1 tablet (40 mg total) by mouth daily.       Allergies:  Allergies  Allergen Reactions  . Delsym [Dextromethorphan Polistirex Er] Hives and Itching    Past Medical History, Surgical history, Social history, and Family History were reviewed and updated.  Review of Systems: Review of Systems  Constitutional: Negative.   HENT: Negative.   Eyes: Negative.   Respiratory: Negative.   Cardiovascular: Negative.   Gastrointestinal: Negative.   Genitourinary: Negative.   Musculoskeletal: Positive for back pain.  Skin: Negative.   Neurological: Negative.   Endo/Heme/Allergies: Negative.   Psychiatric/Behavioral: Negative.       Physical Exam:  weight is 164 lb (74.4 kg). Her oral temperature is 98.5 F (36.9 C). Her blood pressure is 123/88 and her pulse is 89. Her respiration is 18 and oxygen saturation is 99%.   Wt Readings from Last 3 Encounters:  06/16/18 164 lb (74.4 kg)  04/03/18 158 lb 8 oz (  71.9 kg)  01/20/18 159 lb (72.1 kg)    Physical Exam Vitals signs reviewed.  HENT:     Head: Normocephalic and atraumatic.     Comments: On the right side of her neck is a sebaceous cyst.  This 5 measures about 5 x 5 mm.  It is slightly firm. Eyes:     Pupils: Pupils are equal, round, and reactive to light.  Neck:     Musculoskeletal: Normal range of motion.  Cardiovascular:     Rate and Rhythm: Normal rate and regular rhythm.     Heart sounds: Normal heart sounds.  Pulmonary:     Effort: Pulmonary effort is normal.     Breath sounds: Normal breath sounds.  Abdominal:     General: Bowel sounds are normal.     Palpations: Abdomen is soft.  Musculoskeletal: Normal range of motion.        General: No tenderness or deformity.  Lymphadenopathy:     Cervical: No  cervical adenopathy.  Skin:    General: Skin is warm and dry.     Findings: No erythema or rash.  Neurological:     Mental Status: She is alert and oriented to person, place, and time.  Psychiatric:        Behavior: Behavior normal.        Thought Content: Thought content normal.        Judgment: Judgment normal.      Lab Results  Component Value Date   WBC 4.6 06/16/2018   HGB 13.5 06/16/2018   HCT 41.1 06/16/2018   MCV 89.7 06/16/2018   PLT 183 06/16/2018   No results found for: FERRITIN, IRON, TIBC, UIBC, IRONPCTSAT Lab Results  Component Value Date   RBC 4.58 06/16/2018   No results found for: KPAFRELGTCHN, LAMBDASER, KAPLAMBRATIO No results found for: Kandis Cocking, IGMSERUM No results found for: Odetta Pink, SPEI   Chemistry      Component Value Date/Time   NA 139 06/16/2018 0849   NA 141 02/17/2017 1100   K 5.0 06/16/2018 0849   K 4.4 02/17/2017 1100   CL 102 06/16/2018 0849   CL 100 02/17/2017 1100   CO2 32 06/16/2018 0849   CO2 32 02/17/2017 1100   BUN 16 06/16/2018 0849   BUN 18 02/17/2017 1100   CREATININE 1.29 (H) 06/16/2018 0849   CREATININE 1.5 (H) 02/17/2017 1100      Component Value Date/Time   CALCIUM 10.0 06/16/2018 0849   CALCIUM 10.0 02/17/2017 1100   ALKPHOS 74 06/16/2018 0849   ALKPHOS 65 02/17/2017 1100   AST 19 06/16/2018 0849   ALT 13 06/16/2018 0849   ALT 15 02/17/2017 1100   BILITOT 0.8 06/16/2018 0849       Impression and Plan: Rachael Lester is a very pleasant 63 yo African American female with history of limited stage small cell lung cancer.  Of note, her birthday is on Easter this year.  I know that it will be a special birthday for her.  I do not see that we have to do any scans on her.  Her last set of CT scans were done in December 2019.  She is now over 2 years out from treatment.  I have to believe that her risk of recurrence now is going to be less than 10%.  I  would just follow her along.  If she has symptoms or if her lab work is abnormal, then we can  get radiographic studies.  Again, hopefully she will have the T8 fracture addressed.  I have her come back in 4 months.    Volanda Napoleon, MD 4/3/202010:05 AM

## 2018-06-26 ENCOUNTER — Other Ambulatory Visit: Payer: Medicaid Other

## 2018-06-26 ENCOUNTER — Ambulatory Visit: Payer: Medicaid Other | Admitting: Family

## 2018-06-30 ENCOUNTER — Other Ambulatory Visit: Payer: Medicaid Other

## 2018-06-30 ENCOUNTER — Ambulatory Visit: Payer: Medicaid Other | Admitting: Hematology & Oncology

## 2018-08-24 ENCOUNTER — Telehealth: Payer: Self-pay | Admitting: *Deleted

## 2018-08-24 NOTE — Telephone Encounter (Signed)
Call received from patient stating that she is in [place], has not had her port flushed since March and would like to know if she should have it flushed in [place] at a Cancer center she has been treated at prior to treatment at [facility].  Pt instructed to have port flushed in [place] if she is able, if not able, to contact [facility] as soon as she gets home so that port flush can be scheduled for her.  Pt appreciative of assistance and has no further questions at this time.

## 2018-09-03 IMAGING — CT CT CHEST W/ CM
2 of 5 series · 12 of 36 positions shown, 15 images · IV contrast (APPLIED)
Comparison: 10/07/2016

CLINICAL DATA: Small cell lung cancer the right lung restaging.
Constipation.

EXAM:
CT CHEST, ABDOMEN, AND PELVIS WITH CONTRAST
TECHNIQUE: Multidetector CT imaging of the chest, abdomen and pelvis was
performed following the standard protocol during bolus
administration of intravenous contrast.
CONTRAST:  80mL [id number] IOPAMIDOL (BG710S-MPP) INJECTION 61%

[Series 2: cap with 2 · axial · 0.79mm/px · z∈[-602,-87]mm · 9 of 127 slices shown, 12 images]
[im 12/127  mediastinal]
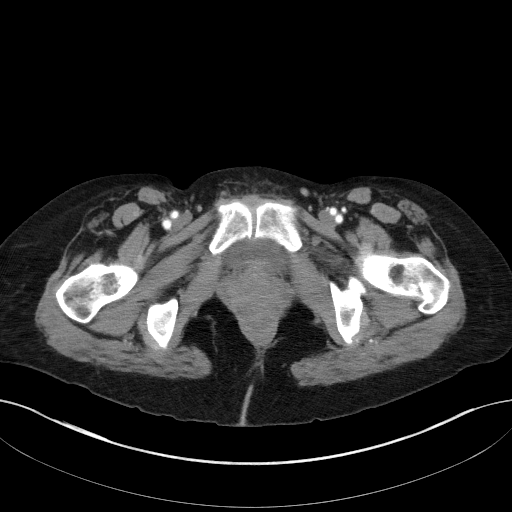
[im 12/127  lung]
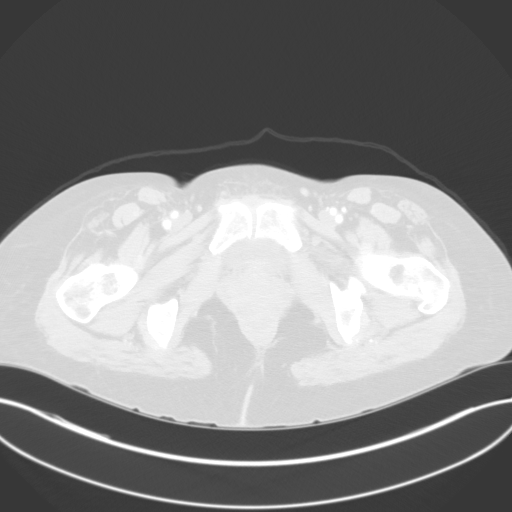
[im 23/127  lung]
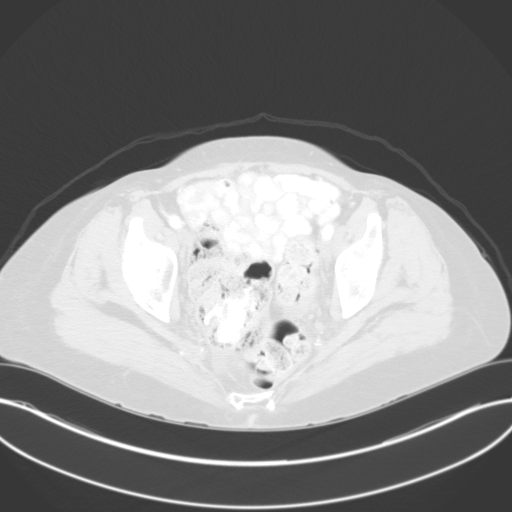
[im 35/127  lung]
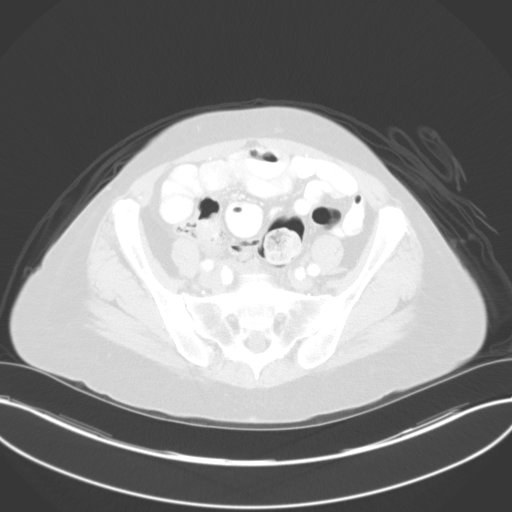
[im 46/127  lung]
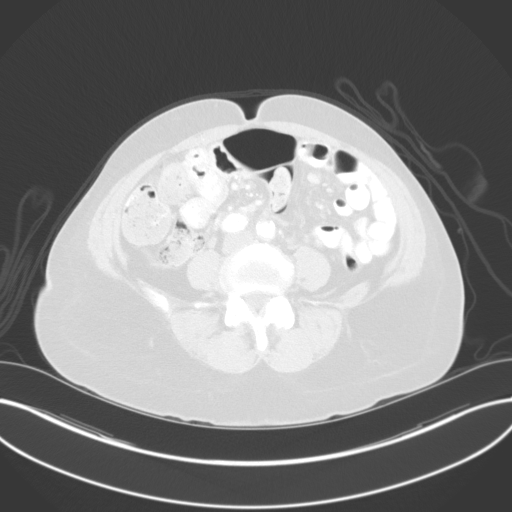
[im 69/127  mediastinal]
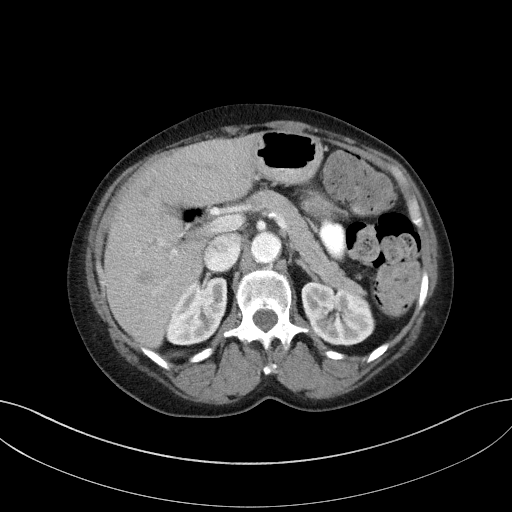
[im 69/127  lung]
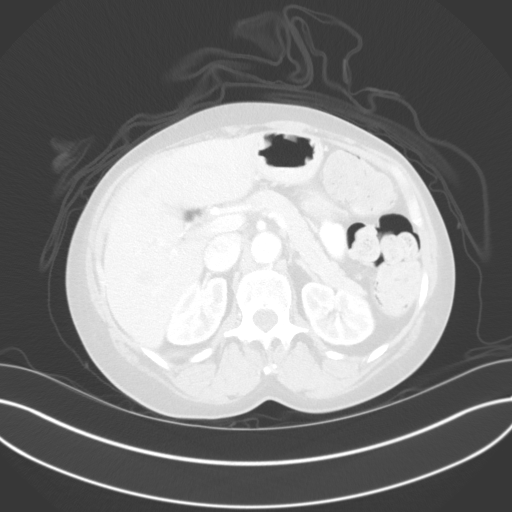
[im 81/127  lung]
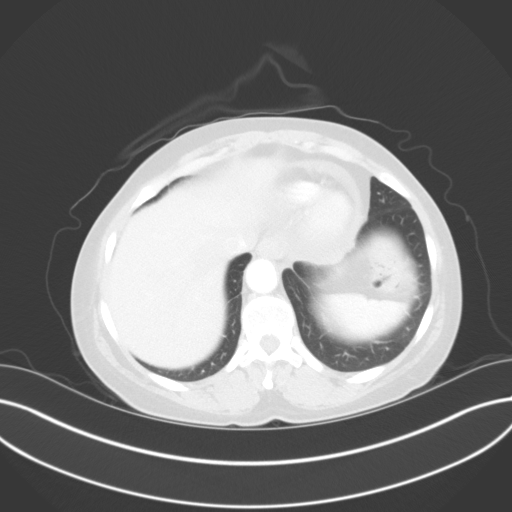
[im 92/127  lung]
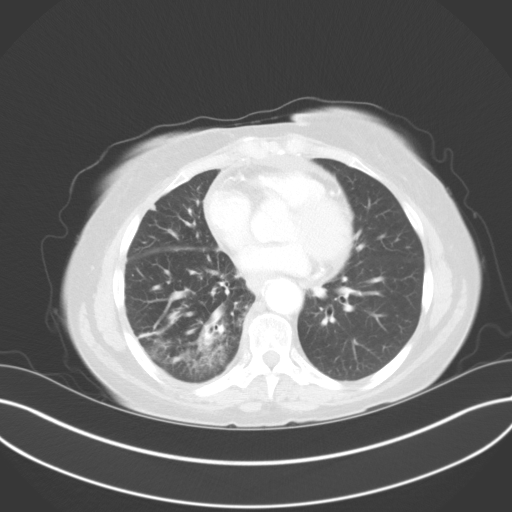
[im 104/127  lung]
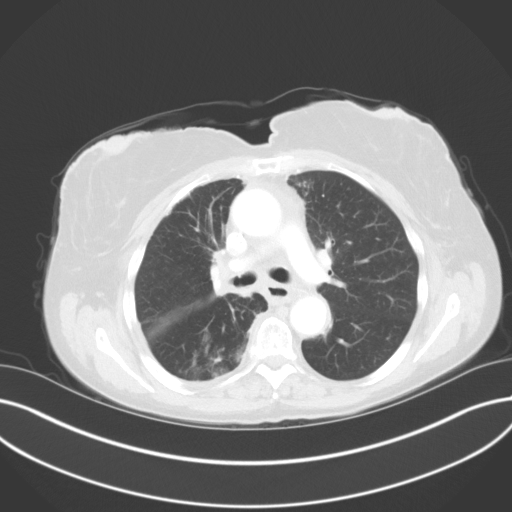
[im 115/127  mediastinal]
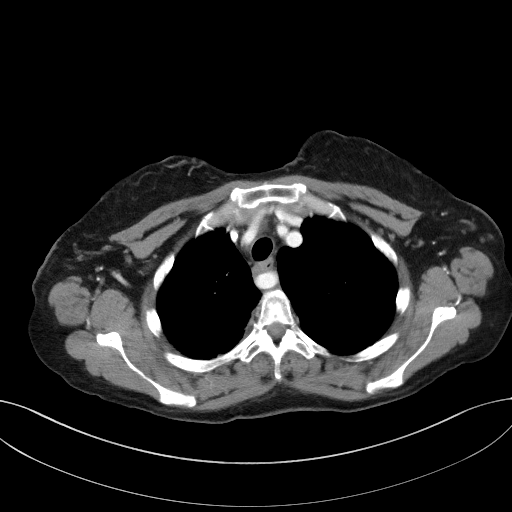
[im 115/127  lung]
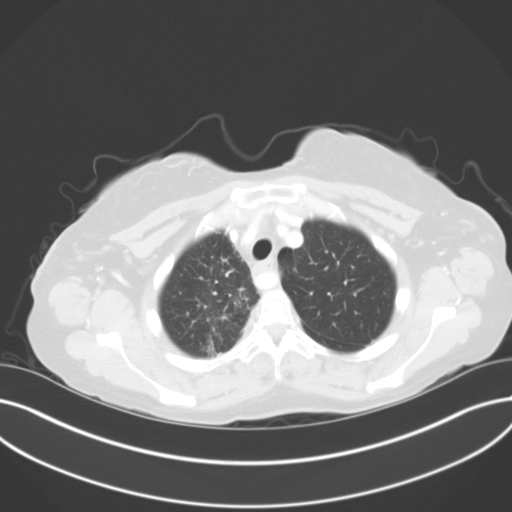

[Series 5: coronals · coronal · 0.79mm/px · 3 of 134 slices shown]
[im 27/134  lung]
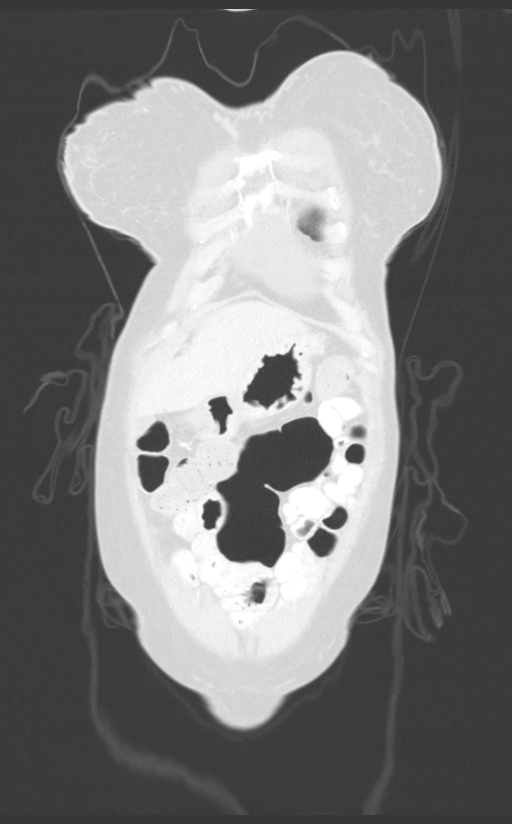
[im 54/134  lung]
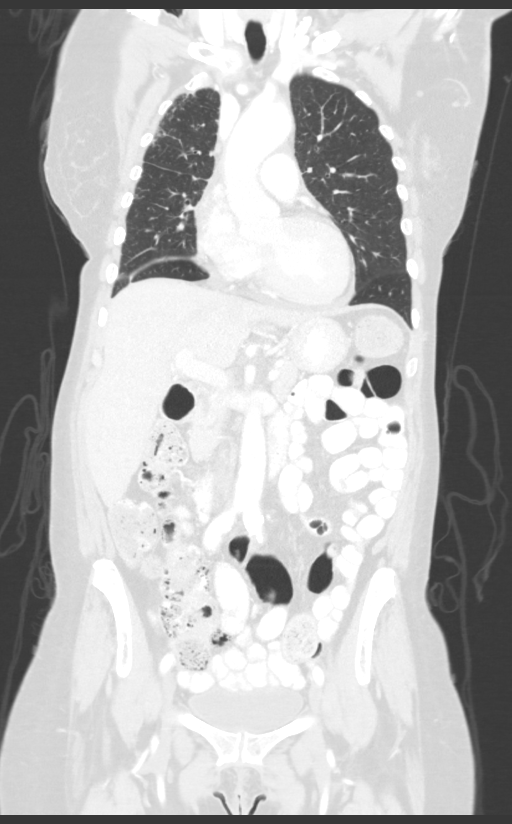
[im 80/134  lung]
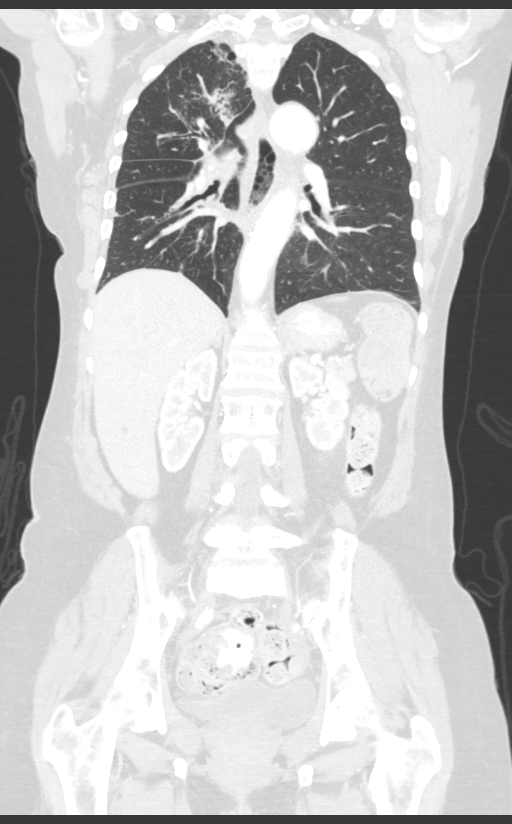

[12 of 36 positions shown; findings below may reference images not displayed]

FINDINGS: CT CHEST FINDINGS

Cardiovascular: Aberrant right subclavian artery passes behind the
esophagus. Coronary atherosclerosis. Moderate pericardial effusion.
Right Port-A-Cath tip: SVC.

Mediastinum/Nodes: Stable wall thickening in the esophagus with
associated air-fluid level. No pathologic thoracic adenopathy.

Lungs/Pleura: Biapical pleuroparenchymal scarring. Progressive right
upper lobe airspace opacities primarily in a peribronchovascular
distribution for example on image 40/4. There is some faint sub
solid density in this vicinity previously but there is no overt
airspace opacity. Stable right apical scarring. Subpleural
reticulations along portions the right upper lobe.

Mildly thickened fissures on the right. Worsening subpleural
airspace opacities in the right lower lobe for example on images 62
through 87 series 4, with some residual subpleural nodular component
including the 1.2 by 0.8 cm lateral nodular component on image 62/4
(formerly 1.4 by 1.0 cm. The dominant region of airspace opacity
medially in the right lower lobe on image 85/4 measures 2.4 by
cm, previously 4.5 by 1.7 cm.

Airway thickening is present along with some stable bandlike
scarring or atelectasis in the right lower lobe extending to the
lung base.

Stable triangular subpleural nodule in the left lower lobe 0.7 by
0.8 cm on image 92/4. Tree-in-bud nodularity medially in the right
upper lobe similar to prior, although the adjacent ground-glass
density nodule has resolved.

Patchy airspace opacities in the right middle lobe adjacent to the
right cardiac border are mildly increased from prior.

Musculoskeletal: Thoracic spondylosis.

CT ABDOMEN PELVIS FINDINGS

Hepatobiliary: There are 2 small stable cyst in the right hepatic
lobe unchanged from the prior exam. Mildly contracted gallbladder.

Pancreas: Partial pancreas divisum.

Spleen: Unremarkable

Adrenals/Urinary Tract: Unremarkable

Stomach/Bowel: Prominent stool throughout the colon favors
constipation. Appendix normal.

Vascular/Lymphatic: Aortoiliac atherosclerotic vascular disease. No
pathologic adenopathy in the abdomen/pelvis.

Reproductive: Uterus absent.  Adnexa unremarkable.

Other: Small amount of right eccentric free pelvic fluid, etiology
uncertain.

Musculoskeletal: Mild left facet arthropathy at L4-5.
IMPRESSION: 1. New airspace opacity centrally in the right upper lobe and
medially in the right middle lobe along with some progression of the
overall airspace opacity in the left lower lobe. In the absence of
interval radiation therapy, infectious etiology would be favored
over progressive malignancy given the infiltrative appearance of the
opacities.
2. Stable atypical infectious bronchiolitis or its residua in the
left upper lobe medially with tree-in-bud opacities.
3. Other imaging findings of potential clinical significance:
Aberrant right subclavian artery. Aortic Atherosclerosis
(OFJFE-[id number].Y) and Emphysema ([id number].M). Coronary atherosclerosis.
Moderate pericardial effusion, stable. Esophageal wall thickening
suspicious for esophagitis. Stable small hypodense liver lesions are
likely cysts. Partial pancreas divisum. Prominent stool throughout
the colon favors constipation. Small amount of pelvic ascites,
etiology uncertain.

## 2018-10-16 ENCOUNTER — Telehealth: Payer: Self-pay | Admitting: Hematology & Oncology

## 2018-10-16 NOTE — Telephone Encounter (Signed)
Patient called to cancel 8/5 appointments due to being out of the state in [place] due to family situation.  She stated she will call back to reschedule when she returns

## 2018-10-18 ENCOUNTER — Ambulatory Visit: Payer: Medicare Other | Admitting: Hematology & Oncology

## 2018-10-18 ENCOUNTER — Other Ambulatory Visit: Payer: Medicare Other

## 2019-09-17 IMAGING — CT CT CHEST W/ CM
2 of 5 series · 11 of 36 positions shown, 13 images · IV contrast (APPLIED)
Comparison: None.

CLINICAL DATA: Lung cancer diagnosed 9455. Radiation chemotherapy
completed May 2016.

EXAM:
CT CHEST, ABDOMEN, AND PELVIS WITH CONTRAST
TECHNIQUE: Multidetector CT imaging of the chest, abdomen and pelvis was
performed following the standard protocol during bolus
administration of intravenous contrast.
CONTRAST:  100mL [id number] IOPAMIDOL ([id number]) INJECTION 61%

[Series 2: cap with 2 · axial · 0.69mm/px · z∈[-420,-40]mm · 8 of 98 slices shown, 10 images]
[im 11/98  mediastinal]
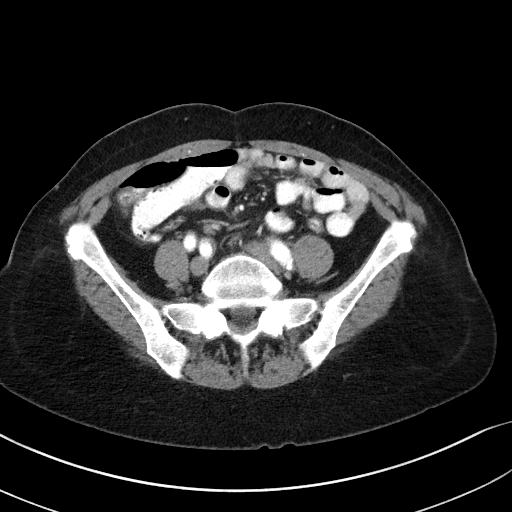
[im 11/98  lung]
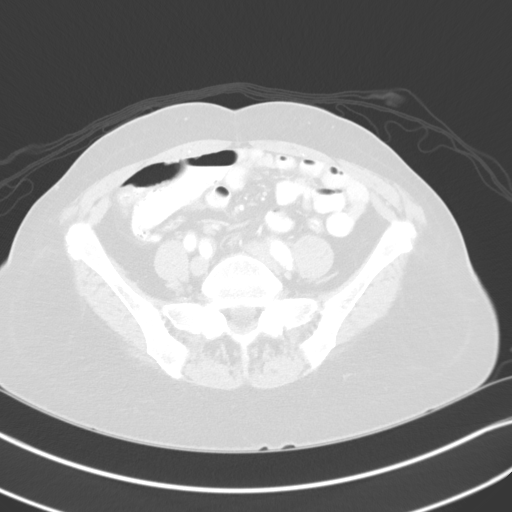
[im 22/98  lung]
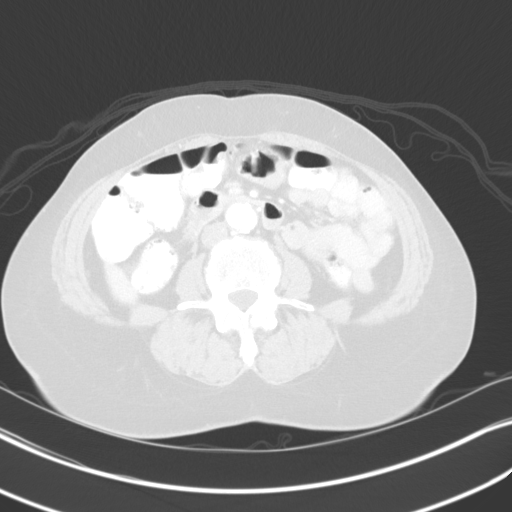
[im 33/98  lung]
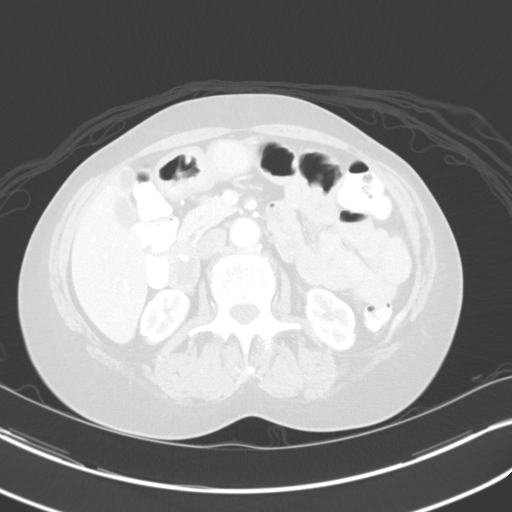
[im 44/98  lung]
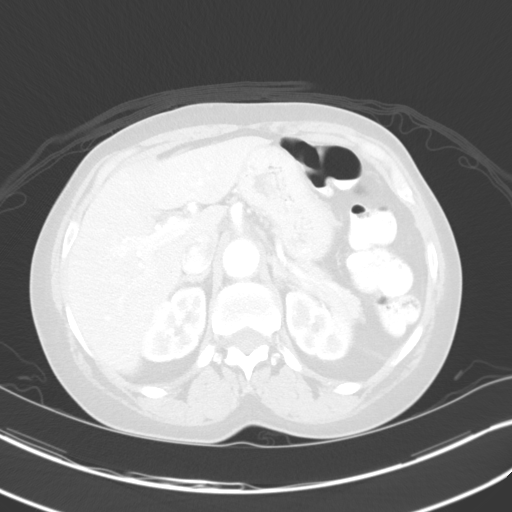
[im 54/98  mediastinal]
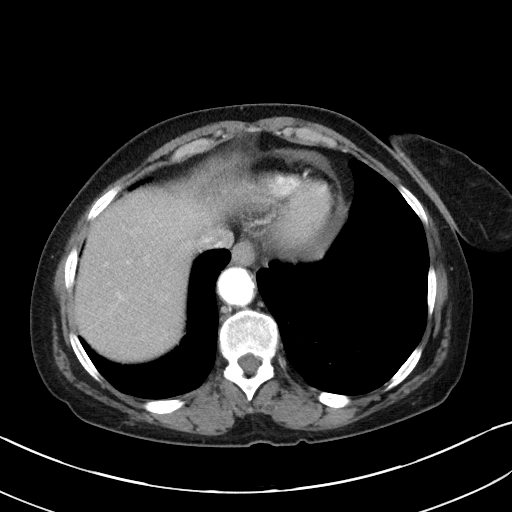
[im 54/98  lung]
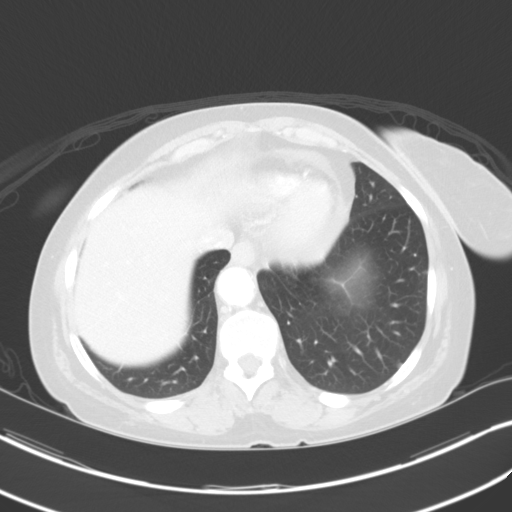
[im 65/98  lung]
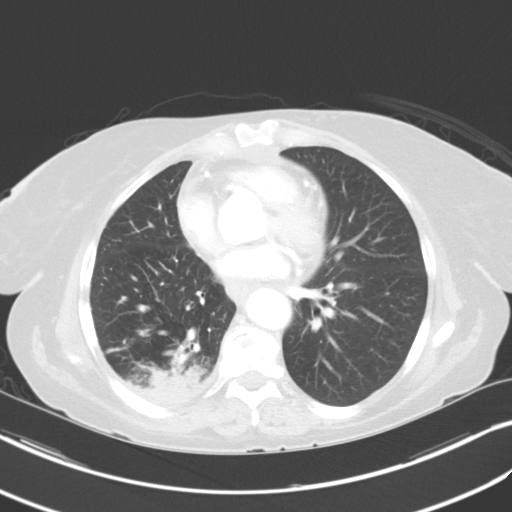
[im 76/98  lung]
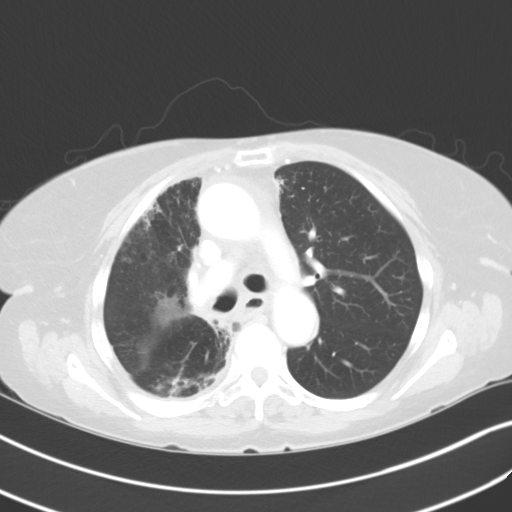
[im 87/98  lung]
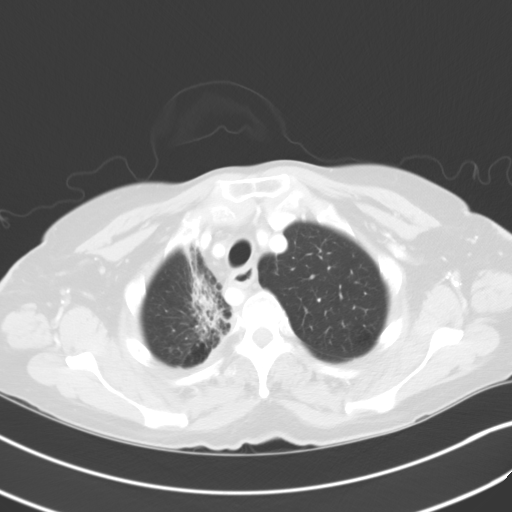

[Series 5: coronals · coronal · 0.75mm/px · 3 of 120 slices shown]
[im 24/120  lung]
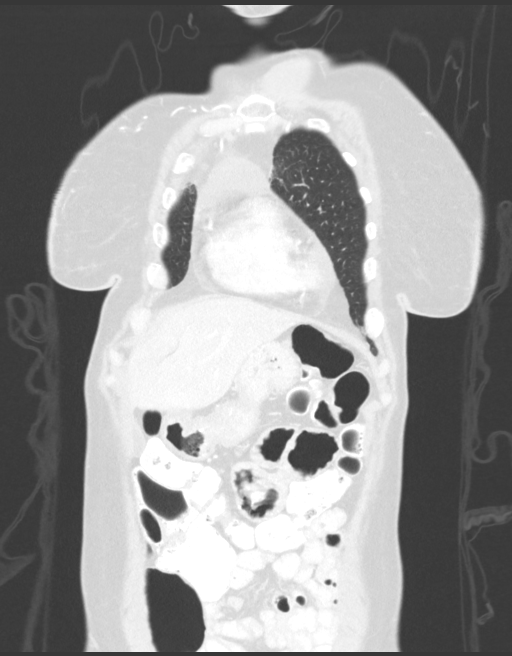
[im 48/120  lung]
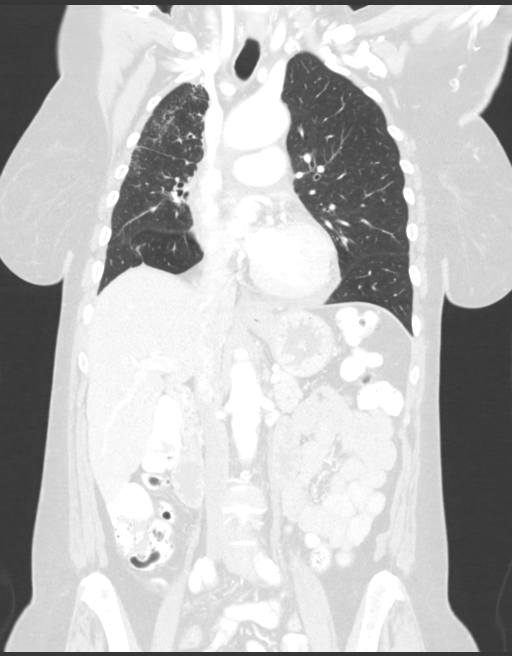
[im 72/120  lung]
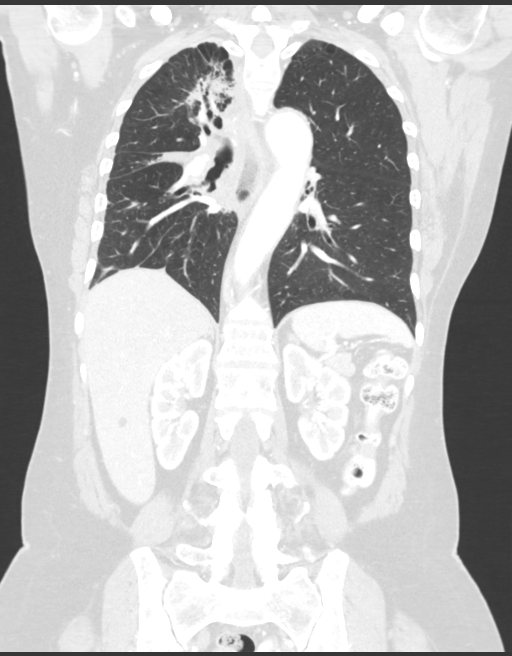

[11 of 36 positions shown; findings below may reference images not displayed]

FINDINGS: CT CHEST FINDINGS

Cardiovascular: Aberrant RIGHT subclavian artery. Normal heart size.
Small pericardial effusion. Port in the anterior chest wall with tip
in distal SVC.

Mediastinum/Nodes: No axillary or supraclavicular adenopathy. No
mediastinal or hilar adenopathy. No pericardial effusion. Small
fluid esophagus.

Lungs/Pleura: RIGHT upper lobe angular consolidation with
bronchiectasis consistent with radiation change. Pattern similar to
comparison exam.

Bronchial thickening in the RIGHT lower lobe with bronchiectasis
also unchanged. Small RIGHT effusion. Thickening along the RIGHT
hilar pleural surface is also unchanged. No new or suspicious
nodules.

LEFT lung is free of suspicious nodules.

Musculoskeletal: No aggressive osseous lesion.

CT ABDOMEN AND PELVIS FINDINGS

Hepatobiliary: Several low-density lesions in the RIGHT hepatic lobe
are unchanged from prior most consistent with benign hepatic cysts.
Gallbladder normal.

Pancreas: Normal pancreatic parenchymal intensity. No ductal
dilatation or inflammation.

Spleen: Normal spleen.

Adrenals/urinary tract: Adrenal glands and kidneys are normal.

Stomach/Bowel: Stomach and limited of the small bowel is
unremarkable

Vascular/Lymphatic: Abdominal aortic normal caliber. No
retroperitoneal periportal lymphadenopathy.

Musculoskeletal: No aggressive osseous lesion
IMPRESSION: Chest Impression:

1. No evidence of lung cancer recurrence.
2. Post radiation change in the RIGHT hemithorax.
3. Stable small pericardial effusion.

Abdomen / Pelvis Impression:

1. No metastatic disease in the abdomen.
2. Benign hepatic cyst.

## 2023-09-22 ENCOUNTER — Encounter (HOSPITAL_COMMUNITY): Payer: Self-pay

## 2023-09-22 ENCOUNTER — Emergency Department (HOSPITAL_COMMUNITY)
Admission: EM | Admit: 2023-09-22 | Discharge: 2023-09-24 | Disposition: A | Discharge status: Home / Self Care | Attending: Emergency Medicine | Admitting: Emergency Medicine

## 2023-09-22 ENCOUNTER — Emergency Department (HOSPITAL_COMMUNITY)

## 2023-09-22 ENCOUNTER — Other Ambulatory Visit: Payer: Self-pay

## 2023-09-22 DIAGNOSIS — Z7982 Long term (current) use of aspirin: Secondary | ICD-10-CM | POA: Insufficient documentation

## 2023-09-22 DIAGNOSIS — W1830XA Fall on same level, unspecified, initial encounter: Secondary | ICD-10-CM | POA: Insufficient documentation

## 2023-09-22 DIAGNOSIS — Z85118 Personal history of other malignant neoplasm of bronchus and lung: Secondary | ICD-10-CM | POA: Insufficient documentation

## 2023-09-22 DIAGNOSIS — Z79899 Other long term (current) drug therapy: Secondary | ICD-10-CM | POA: Insufficient documentation

## 2023-09-22 DIAGNOSIS — M25552 Pain in left hip: Secondary | ICD-10-CM | POA: Diagnosis present

## 2023-09-22 DIAGNOSIS — R531 Weakness: Secondary | ICD-10-CM | POA: Diagnosis not present

## 2023-09-22 DIAGNOSIS — I10 Essential (primary) hypertension: Secondary | ICD-10-CM | POA: Insufficient documentation

## 2023-09-22 DIAGNOSIS — W19XXXA Unspecified fall, initial encounter: Secondary | ICD-10-CM

## 2023-09-22 LAB — CBC WITH DIFFERENTIAL/PLATELET
Abs Immature Granulocytes: 0.02 K/uL (ref 0.00–0.07)
Basophils Absolute: 0 K/uL (ref 0.0–0.1)
Basophils Relative: 1 %
Eosinophils Absolute: 0.1 K/uL (ref 0.0–0.5)
Eosinophils Relative: 1 %
HCT: 41.1 % (ref 36.0–46.0)
Hemoglobin: 13.4 g/dL (ref 12.0–15.0)
Immature Granulocytes: 0 %
Lymphocytes Relative: 25 %
Lymphs Abs: 1.3 K/uL (ref 0.7–4.0)
MCH: 27.6 pg (ref 26.0–34.0)
MCHC: 32.6 g/dL (ref 30.0–36.0)
MCV: 84.6 fL (ref 80.0–100.0)
Monocytes Absolute: 0.4 K/uL (ref 0.1–1.0)
Monocytes Relative: 7 %
Neutro Abs: 3.7 K/uL (ref 1.7–7.7)
Neutrophils Relative %: 66 %
Platelets: 229 K/uL (ref 150–400)
RBC: 4.86 MIL/uL (ref 3.87–5.11)
RDW: 16.7 % — ABNORMAL HIGH (ref 11.5–15.5)
WBC: 5.5 K/uL (ref 4.0–10.5)
nRBC: 0 % (ref 0.0–0.2)

## 2023-09-22 LAB — COMPREHENSIVE METABOLIC PANEL WITH GFR
ALT: 7 U/L (ref 0–44)
AST: 18 U/L (ref 15–41)
Albumin: 3.8 g/dL (ref 3.5–5.0)
Alkaline Phosphatase: 64 U/L (ref 38–126)
Anion gap: 9 (ref 5–15)
BUN: 12 mg/dL (ref 8–23)
CO2: 27 mmol/L (ref 22–32)
Calcium: 10.1 mg/dL (ref 8.9–10.3)
Chloride: 101 mmol/L (ref 98–111)
Creatinine, Ser: 1.13 mg/dL — ABNORMAL HIGH (ref 0.44–1.00)
GFR, Estimated: 54 mL/min — ABNORMAL LOW (ref >60)
Glucose, Bld: 82 mg/dL (ref 70–99)
Potassium: 4 mmol/L (ref 3.5–5.1)
Sodium: 137 mmol/L (ref 135–145)
Total Bilirubin: 0.9 mg/dL (ref 0.0–1.2)
Total Protein: 8.2 g/dL — ABNORMAL HIGH (ref 6.5–8.1)

## 2023-09-22 LAB — CBG MONITORING, ED: Glucose-Capillary: 88 mg/dL (ref 70–99)

## 2023-09-22 MED ORDER — IOHEXOL 350 MG/ML SOLN
75.0000 mL | Freq: Once | INTRAVENOUS | Status: AC | PRN
  Administered 2023-09-22: 75 mL via INTRAVENOUS

## 2023-09-22 NOTE — ED Notes (Signed)
 Pt attempted to provide urine sample but said she is unable to at this time.

## 2023-09-22 NOTE — ED Triage Notes (Signed)
 Pt BIB [facility] from home for multiple falls. Pt has fallen x4 today, no LOC, no head injury, no blood thinners. Pt reports feeling week x4 days. VSS per EMS, started new BP med 1 mo ago.

## 2023-09-22 NOTE — ED Notes (Signed)
 Patient transported to CT

## 2023-09-22 NOTE — ED Provider Notes (Signed)
  Physical Exam  BP (!) 153/89   Pulse 85   Temp (!) 97.5 F (36.4 C) (Oral)   Resp (!) 23   Ht 5' 6 (1.676 m)   Wt 73.5 kg   SpO2 99%   BMI 26.15 kg/m   Physical Exam  Procedures  Procedures  ED Course / MDM   Clinical Course as of 09/23/23 0216  Thu Sep 22, 2023  2044 CT Head Wo Contrast No CT evidence of acute intracranial abnormality. Extensive chronic microvascular ischemic changes. 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 Mucosal thickening and layering secretions in the left maxillary sinus.  Chronic appearing deformity of the left lamina papyracea.   [AD]  2044 DG Chest Portable 1 View Large right perihilar and paratracheal density is noted which may represent post treatment fibrosis with possible underlying neoplasm or malignancy.   [AD]  2045 DG Pelvis 1-2 Views There is no evidence of pelvic fracture or diastasis. No pelvic bone lesions are seen.   [AD]  2201 CT Angio Chest PE W and/or Wo Contrast No evidence of significant pulmonary embolus. Focal stenosis versus extrinsic compression in a right anterior upper lobe pulmonary artery. Dense consolidation, volume loss, and air bronchograms in the medial right lung likely represent postradiation changes. No definite evidence of residual recurrent mass although focal lesions could be obscured by the consolidative process. Small pericardial effusion. Aortic atherosclerosis. Emphysematous changes in the lungs   [AD]  2310 Dispo waiting on UA and ambulate w/ pulse ox, if both normal okay to d/c with pulm f/u. [JB]  Fri Sep 23, 2023  0209 10/07/23 with pulm.  [JB]    Clinical Course User Index [AD] Raoul Rake, MD [JB] Anhthu Perdew, Warren SAILOR, PA-C   Medical Decision Making Amount and/or Complexity of Data Reviewed Labs: ordered. Radiology: ordered. Decision-making details documented in ED Course.  Risk Prescription drug management.    At the time of shift handoff, patient's labs  have resulted as well as imaging.  We are waiting on UA.  Patient does not have urinary symptoms.  She has been hemodynamically stable throughout stay.  Please see prior ED provider Dr. Orval note for further details.  Plan is to rule out UA and ambulate patient then discharge.   UA negative.  Patient was able to stand but did not feel stable ambulating.  Patient reports that she has gradually been having difficulty with ambulation over the course of the 1 year. She has no focal deficits.  She denies any acute symptoms and does not have a walker at home.  Ultimately I feel she would benefit from home health and walking device however patient is from [place]  visiting, so will set up with walker prior to discharge in interim.  She will follow-up with her primary care doctor for consideration of home health care needs.  Will follow-up with pulmonology in regards to her CTA results.  She has a appointment in 1 week.  Once patient has DME for ambulation, feel stable for discharge home. Family member updated.      Amandeep Hogston N, PA-C 09/23/23 [id number]    Darra Fonda MATSU, MD 09/23/23 [phone]

## 2023-09-22 NOTE — ED Notes (Signed)
 Pt brief changed.

## 2023-09-22 NOTE — ED Provider Notes (Signed)
 [place] EMERGENCY DEPARTMENT AT [facility] Provider Note   CSN: [id number] Arrival date & time: 09/22/23  1902   Patient presents with: Rachael Lester is a 68 y.o. female with past medical history of hypertension and small cell lung carcinoma s/p chemoradiation who presents with 1 day of increasing frequency of ground-level falls and increased urinary frequency/urgency.  Patient states that she lives in [place]  but is visiting family here this week.  States that she does tend to have occasional ground-level falls every few days, but today has fallen 4 times and is feeling overall very weak.  Patient states that every time she falls, her legs suddenly give out underneath her and she falls onto her backside.  Denies any head injury or LOC with any falls today or other recent falls.  She also notes increased urinary urgency and frequency for the last 1-2 days, with multiple occurrences today of urinary incontinence before she is able to get to the bathroom.  Denies any urinary incontinence at baseline.  Reports decreased appetite for the last few weeks, has not eaten or drank anything today.  Is unsure if she has had any significant weight loss recently.  Denies N/V or diarrhea, does endorse some constipation recently.    Prior to Admission medications   Medication Sig Start Date End Date Taking? Authorizing Provider  ALPRAZolam  (XANAX ) 0.25 MG tablet TAKE 1 TABLET BY MOUTH TWICE A DAY IF NEEDED FOR ANXIETY 06/13/17   Timmy Maude SAUNDERS, MD  aspirin  EC 81 MG tablet Take 81 mg by mouth daily.    [provider]  atorvastatin (LIPITOR) 20 MG tablet Take 20 mg by mouth at bedtime. 08/19/17   [provider]  carvedilol  (COREG ) 3.125 MG tablet TAKE 1 TABLET BY MOUTH TWICE A DAY WITH MEALS 06/13/17   Ennever, Maude SAUNDERS, MD  oxyCODONE  (OXY IR/ROXICODONE ) 5 MG immediate release tablet TAKE 1 TABLET BY MOUTH EVERY 6 HOURS AS NEEDED FOR SEVERE PAIN 06/16/18   Timmy Maude SAUNDERS, MD  pantoprazole  (PROTONIX ) 40 MG tablet Take 1 tablet (40 mg total) by mouth daily. 09/20/16   Timmy Maude SAUNDERS, MD    Allergies: Delsym [dextromethorphan polistirex er]     Updated Vital Signs BP (!) 147/105 (BP Location: Left Arm)   Pulse 90   Temp (!) 97.5 F (36.4 C) (Oral)   Resp 11   Ht 5' 6 (1.676 m)   Wt 73.5 kg   SpO2 100%   BMI 26.15 kg/m   Physical Exam Vitals reviewed.  Constitutional:      General: She is not in acute distress.    Appearance: She is not toxic-appearing or diaphoretic.  HENT:     Head: Normocephalic and atraumatic.     Nose: Nose normal. No rhinorrhea.     Mouth/Throat:     Mouth: Mucous membranes are moist.     Pharynx: Oropharynx is clear.  Eyes:     General: No scleral icterus.    Extraocular Movements: Extraocular movements intact.     Pupils: Pupils are equal, round, and reactive to light.  Cardiovascular:     Rate and Rhythm: Normal rate and regular rhythm.     Heart sounds: No murmur heard.    No gallop.  Pulmonary:     Effort: Pulmonary effort is normal. No respiratory distress.     Breath sounds: No stridor. Rhonchi (RML) present. No wheezing.  Chest:     Chest wall: No tenderness.  Abdominal:     General: Abdomen is flat. There is no distension.     Palpations: Abdomen is soft.     Tenderness: There is no abdominal tenderness. There is no right CVA tenderness, left CVA tenderness or guarding.  Musculoskeletal:        General: No tenderness or deformity. Normal range of motion.     Cervical back: Normal range of motion and neck supple.     Right lower leg: No edema.     Left lower leg: No edema.  Skin:    General: Skin is warm and dry.     Capillary Refill: Capillary refill takes less than 2 seconds.     Findings: No lesion.     Comments: No significant bruising of the buttocks  Neurological:     Mental Status: She is alert and oriented to person, place, and time.     Sensory: No sensory deficit.     (all labs  ordered are listed, but only abnormal results are displayed) Labs Reviewed  URINALYSIS, ROUTINE W REFLEX MICROSCOPIC - Abnormal; Notable for the following components:      Result Value   Color, Urine STRAW (*)    All other components within normal limits  CBC WITH DIFFERENTIAL/PLATELET - Abnormal; Notable for the following components:   RDW 16.7 (*)    All other components within normal limits  COMPREHENSIVE METABOLIC PANEL WITH GFR - Abnormal; Notable for the following components:   Creatinine, Ser 1.13 (*)    Total Protein 8.2 (*)    GFR, Estimated 54 (*)    All other components within normal limits  URINE CULTURE  CBG MONITORING, ED    EKG: EKG Interpretation Date/Time:  Thursday September 22 2023 19:49:55 EDT Ventricular Rate:  78 PR Interval:  165 QRS Duration:  92 QT Interval:  405 QTC Calculation: 462 R Axis:   -19  Text Interpretation: Sinus rhythm Borderline left axis deviation Low voltage, precordial leads ST elevation, consider inferior injury Confirmed by Bari Flank [id number] on 09/22/2023 10:05:28 PM  Radiology: CT Angio Chest PE W and/or Wo Contrast Result Date: 09/22/2023 CLINICAL DATA:  Concern for pulmonary embolus and/or lung malignancy. Chief complaint of falls. EXAM: CT ANGIOGRAPHY CHEST WITH CONTRAST TECHNIQUE: Multidetector CT imaging of the chest was performed using the standard protocol during bolus administration of intravenous contrast. Multiplanar CT image reconstructions and MIPs were obtained to evaluate the vascular anatomy. RADIATION DOSE REDUCTION: This exam was performed according to the departmental dose-optimization program which includes automated exposure control, adjustment of the mA and/or kV according to patient size and/or use of iterative reconstruction technique. CONTRAST:  75mL OMNIPAQUE  IOHEXOL  350 MG/ML SOLN COMPARISON:  Chest radiograph 09/22/2023.  CT chest 03/03/2018 FINDINGS: Cardiovascular: There is good opacification of the central and  segmental pulmonary arteries. There is focal loss of vascular flow in the right anterior upper lobe pulmonary artery. This appears to represent extrinsic compression or stenosis rather than intraluminal filling defect. No definite evidence of significant pulmonary embolus. Normal heart size. Small pericardial effusion. Normal caliber thoracic aorta. No aortic dissection. Mild calcification of the aorta and coronary arteries. 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 This represents normal anatomic variation. Right central venous catheter with tip in the SVC. Mediastinum/Nodes: Thyroid gland is unremarkable. Esophagus is decompressed. No significant lymphadenopathy is identified. Lungs/Pleura: Dense consolidation and volume loss with air bronchograms demonstrated in the medial aspect of the right lung. Nonsegmental pattern of involvement suggest  this is likely to represent previous radiation treatment changes. There is some progression of volume loss since the previous study from 2019. No definite residual recurrent mass lesion although small focal lesions could be obscured by the consolidative process. Emphysematous changes in the lungs. 4.5 mm subpleural nodule in the right middle lung, series 5, image 63. This is unchanged since prior study and probably represents a prominent lymph node. No imaging follow-up is indicated. Upper Abdomen: No acute abnormalities are demonstrated. Musculoskeletal: No acute bony abnormalities. No focal bone lesions. Sclerosis in some of the right posteromedial ribs is likely to represent post radiation change. Review of the MIP images confirms the above findings. IMPRESSION: 1. No evidence of significant pulmonary embolus. 2. Focal stenosis versus extrinsic compression in a right anterior upper lobe pulmonary artery. This is likely related to post treatment changes. 3. Dense consolidation, volume loss, and air bronchograms in the medial  right lung likely represent postradiation changes. No definite evidence of residual recurrent mass although focal lesions could be obscured by the consolidative process. 4. Small pericardial effusion. 5. Aortic atherosclerosis. 6. Emphysematous changes in the lungs. Electronically Signed   By: Elsie Gravely M.D.   On: 09/22/2023 21:51   CT Head Wo Contrast Result Date: 09/22/2023 CLINICAL DATA:  Trauma, multiple ground level falls. EXAM: CT HEAD WITHOUT CONTRAST TECHNIQUE: Contiguous axial images were obtained from the base of the skull through the vertex without intravenous contrast. RADIATION DOSE REDUCTION: This exam was performed according to the departmental dose-optimization program which includes automated exposure control, adjustment of the mA and/or kV according to patient size and/or use of iterative reconstruction technique. COMPARISON:  CT head 10/07/2016. FINDINGS: Brain: No acute intracranial hemorrhage. No CT evidence of acute infarct. Nonspecific hypoattenuation in the periventricular and subcortical white matter favored to reflect chronic microvascular ischemic changes. Remote lacunar infarcts in the bilateral thalami. No edema, mass effect, or midline shift. The basilar cisterns are patent. Ventricles: The ventricles are normal. Vascular: Atherosclerotic calcifications of the carotid siphons and intracranial vertebral arteries. No hyperdense vessel. Skull: No acute or aggressive finding. Chronic deformity of the left lamina papyracea. Orbits: Orbits are symmetric. Sinuses: Mucosal thickening and layering secretions in the left maxillary sinus. Mucosal thickening in the left posterior ethmoid air cells. Other: Mastoid air cells are clear. IMPRESSION: No CT evidence of acute intracranial abnormality. Extensive chronic microvascular ischemic changes. 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 Mucosal thickening and layering secretions in the left maxillary sinus. Recommend  correlation for acute sinusitis. Chronic appearing deformity of the left lamina papyracea. Electronically Signed   By: Donnice Mania M.D.   On: 09/22/2023 20:38   DG Chest Portable 1 View Result Date: 09/22/2023 CLINICAL DATA:  Multiple falls. EXAM: PORTABLE CHEST 1 VIEW COMPARISON:  November 02, 2011.  March 03, 2018. FINDINGS: Stable cardiac size. Left lung is clear. Right subclavian Port-A-Cath is noted with distal tip in expected position of the SVC. Minimal right basilar scarring is noted. Large right perihilar and paratracheal density is noted which may represent post treatment fibrosis as well as possible underlying neoplasm. Bony thorax is unremarkable. IMPRESSION: Large right perihilar and paratracheal density is noted which may represent post treatment fibrosis with possible underlying neoplasm or malignancy. Electronically Signed   By: Lynwood Landy Raddle M.D.   On: 09/22/2023 20:09   DG Pelvis 1-2 Views Result Date: 09/22/2023 CLINICAL DATA:  Multiple falls. EXAM: PELVIS - 1-2 VIEW COMPARISON:  None Available. FINDINGS: There is no evidence of  pelvic fracture or diastasis. No pelvic bone lesions are seen. IMPRESSION: Negative. Electronically Signed   By: Lynwood Landy Raddle M.D.   On: 09/22/2023 20:07     Medications Ordered in the ED - No data to display  Clinical Course as of 09/23/23 1331  Thu Sep 22, 2023  2044 CT Head Wo Contrast No CT evidence of acute intracranial abnormality. Extensive chronic microvascular ischemic changes. 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 Mucosal thickening and layering secretions in the left maxillary sinus.  Chronic appearing deformity of the left lamina papyracea.   [AD]  2044 DG Chest Portable 1 View Large right perihilar and paratracheal density is noted which may represent post treatment fibrosis with possible underlying neoplasm or malignancy.   [AD]  2045 DG Pelvis 1-2 Views There is no evidence of pelvic fracture or  diastasis. No pelvic bone lesions are seen.   [AD]  2201 CT Angio Chest PE W and/or Wo Contrast No evidence of significant pulmonary embolus. Focal stenosis versus extrinsic compression in a right anterior upper lobe pulmonary artery. Dense consolidation, volume loss, and air bronchograms in the medial right lung likely represent postradiation changes. No definite evidence of residual recurrent mass although focal lesions could be obscured by the consolidative process. Small pericardial effusion. Aortic atherosclerosis. Emphysematous changes in the lungs   [AD]  Fri Sep 23, 2023    Clinical Course User Index [AD] Raoul Rake, MD [JB] Barrett, Warren SAILOR, PA-C    Medical Decision Making Patient with the above history is presenting with acute-on-chronic GLFs, with increased frequency to 4 total GLFs so far today. Patient states that these falls always occur when her legs spontaneously give out underneath her, rather than her tripping on something or her feeling dizzy/off balance. She also has new increased urinary frequency and urgency resulting in several episodes of urinary incontinence today which are abnormal for her. She has had a few weeks of decreased appetite and today has not had any po, including fluids.  Differentials considered include: general deconditioning, dehydration/hypovolemia, UTI, metabolic/electrolyte derangements, hypoglycemia, CVA, PE, or recurrence of prior lung CA. Regarding trauma from her falls, there are no notable deformities or injuries noted on exam and patient has no focal tenderness throughout. Will get XR pelvis to rule out occult injury given that she has fallen onto her backside multiple times today.  Will also check basic labs, UA/culture, EKG, CXR, CT head, and following results of CXR as above, added on CT PE.  As above in ED course, imaging workup is overall negative for acute findings. CXR and CT PE concerning for dense consolidation of the R medial  lung favored to be chronic postradiation changes, and some extrinsic compression on the nearby pulmonary arteries, but no acute PE or signs of recurrent malignancy. Patient updated on these findings and we discussed close f/u with her pulmonologist back home in [place]  as soon as possible for further monitoring.  At time of handoff, patient was pending her urine studies and assessment of ambulation in the ED. Please see the following ED provider's note for further documentation of care and ultimate dispo.   Amount and/or Complexity of Data Reviewed Labs: ordered.    Details: Poc glucose 88, CMP and CBC unremarkable  Radiology: ordered. Decision-making details documented in ED Course. ECG/medicine tests: ordered.    Details: Interpretation as above  Risk Prescription drug management.    Final diagnoses:  Fall, initial encounter  Left hip pain  Generalized weakness  Raoul Rake, MD 09/23/23 1343    Bari Roxie HERO, DO 10/01/23 1540

## 2023-09-22 NOTE — ED Notes (Signed)
 Pt provided with ice water

## 2023-09-23 LAB — URINALYSIS, ROUTINE W REFLEX MICROSCOPIC
Bacteria, UA: NONE SEEN
Bilirubin Urine: NEGATIVE
Glucose, UA: NEGATIVE mg/dL
Hgb urine dipstick: NEGATIVE
Ketones, ur: NEGATIVE mg/dL
Leukocytes,Ua: NEGATIVE
Nitrite: NEGATIVE
Protein, ur: NEGATIVE mg/dL
Specific Gravity, Urine: 1.025 (ref 1.005–1.030)
pH: 6 (ref 5.0–8.0)

## 2023-09-23 NOTE — ED Notes (Signed)
 Pt unable to ambulate due to left leg weakness. Pt unsteady on feet when standing.

## 2023-09-23 NOTE — ED Notes (Signed)
 Pt needed maximum assistance with 2 nurses to get from the bed to the bedside commode.

## 2023-09-23 NOTE — Discharge Planning (Signed)
 Transition of Care [facility]) - Emergency Department Mini Assessment   Patient Details  Name: Rachael Lester MRN: [id number] Date of Birth: 04/28/1955  Transition of Care [facility]) CM/SW Contact:    Debarah Saunas, RN Phone Number: 09/23/2023, 11:46 AM   Clinical Narrative: dermatitisCamellia Debarah, BSN, RN, UTAH [phone] Pt qualifies for DME [facility]) standard wheelcahir.  DME  ordered through [employer].  Ryan of [employer] notified to deliver DME to pt room prior to D/C home.    ED Mini Assessment: What brought you to the Emergency Department? : my legs are weak and I keep falling  Barriers to Discharge: ED DME delivery     Means of departure: Car  Interventions which prevented an admission or readmission: DME Provided    Patient Contact and Communications     Spoke with: Juanita Contact Date: 09/23/23,   Contact time: 1000 Contact Phone Number: [phone] Call outcome: successful  Patient states their goals for this hospitalization and ongoing recovery are:: to return home with daughter and do some exercises to get stronger CMS Medicare.gov Compare Post Acute Care list provided to:: Patient Choice offered to / list presented to : Patient  Admission diagnosis:  falls Patient Active Problem List   Diagnosis Date Noted   SCLC (small cell lung carcinoma), right (HCC) 10/21/2016   PCP:  Freddrick, No Pharmacy:   [facility] [place] - [facility] [address], [address] [place] [address] Phone: [phone] Fax: [phone]  [id number] [place], [place] - [address] AT [facility] [address] [place] [address] Phone: [phone] Fax: [phone]

## 2023-09-23 NOTE — ED Notes (Signed)
 Assuming care of this pt a this time.

## 2023-09-23 NOTE — Discharge Instructions (Signed)
 Please follow-up with pulmonologist as discussed for findings of CT scan.  Please follow-up with primary care doctor and consider getting home health care.  Use a walker or cane to help prevent falls.  Please return to emergency room with new or worsening symptoms.

## 2023-09-23 NOTE — ED Notes (Signed)
 PTAR has been arranged for the patient to return to her apartment.  PTAR was notified of directions to get to her apartment and that there will be 15 steps to walk before arriving to the patient's apartment.  The driver stated that should not be a problem.

## 2023-09-23 NOTE — ED Provider Notes (Addendum)
 Emergency Medicine Observation Re-evaluation Note  Rachael Lester is a 68 y.o. female, seen on rounds today.  Pt initially presented to the ED for complaints of Fall Currently, the patient is resting comfortably.  TOC assessing the patient.  Physical Exam  BP (!) 142/100 (BP Location: Right Arm)   Pulse 85   Temp 98.4 F (36.9 C) (Oral)   Resp 18   Ht 5' 6 (1.676 m)   Wt 73.5 kg   SpO2 100%   BMI 26.15 kg/m  Physical Exam General: No acute distress Cardiac: Regular rate Lungs: No respiratory distress Psych: Currently calm  ED Course / MDM  EKG:EKG Interpretation Date/Time:  Thursday September 22 2023 19:49:55 EDT Ventricular Rate:  78 PR Interval:  165 QRS Duration:  92 QT Interval:  405 QTC Calculation: 462 R Axis:   -19  Text Interpretation: Sinus rhythm Borderline left axis deviation Low voltage, precordial leads ST elevation, consider inferior injury Confirmed by Bari Flank [phone]) on 09/22/2023 10:05:28 PM  I have reviewed the labs performed to date as well as medications administered while in observation.  Recent changes in the last 24 hours include patient was seen in the ER for multiple falls.  TOC was consulted.  Initial workup is normal.  Patient is stable for discharge, pending TOC clearance.  Plan  Current plan is for placing TOC recommended orders of wheelchair and home health PT-OT.SABRA  1:59 PM Wheelchair has been delivered.  I went and talked to the patient again.  She states that she is ready to go home.  Her daughter has just left the bedside.  Patient has declined SNF placement discussion that was discussed with her earlier.  Patient states that she is just visiting [place] , discussed return precautions.   Rachael Sora, MD 09/23/23 1359

## 2023-09-24 LAB — URINE CULTURE: Culture: NO GROWTH

## 2023-09-24 NOTE — ED Notes (Signed)
 Phone left after pt was D/C attemped to call daughter 4x

## 2023-09-24 NOTE — ED Notes (Signed)
 Just spoke with daughter she is otw to pick up pt phone that was left.

## 2023-09-24 NOTE — ED Notes (Signed)
 PT D/C'D AFTER INSTRUCTIONS REVIEWED. PT VERBALIZED UNDERSTANDING. NAD REPORTED OR NOTED AT THIS TIME.

## 2023-09-25 ENCOUNTER — Emergency Department (HOSPITAL_COMMUNITY)

## 2023-09-25 ENCOUNTER — Other Ambulatory Visit: Payer: Self-pay

## 2023-09-25 ENCOUNTER — Encounter (HOSPITAL_COMMUNITY): Payer: Self-pay | Admitting: Emergency Medicine

## 2023-09-25 ENCOUNTER — Emergency Department (HOSPITAL_COMMUNITY)
Admission: EM | Admit: 2023-09-25 | Discharge: 2023-09-28 | Disposition: A | Discharge status: Home / Self Care | Attending: Emergency Medicine | Admitting: Emergency Medicine

## 2023-09-25 DIAGNOSIS — I10 Essential (primary) hypertension: Secondary | ICD-10-CM | POA: Insufficient documentation

## 2023-09-25 DIAGNOSIS — Z7982 Long term (current) use of aspirin: Secondary | ICD-10-CM | POA: Diagnosis not present

## 2023-09-25 DIAGNOSIS — R296 Repeated falls: Secondary | ICD-10-CM | POA: Insufficient documentation

## 2023-09-25 DIAGNOSIS — Z859 Personal history of malignant neoplasm, unspecified: Secondary | ICD-10-CM | POA: Diagnosis not present

## 2023-09-25 DIAGNOSIS — I700 Atherosclerosis of aorta: Secondary | ICD-10-CM | POA: Diagnosis not present

## 2023-09-25 DIAGNOSIS — R2681 Unsteadiness on feet: Secondary | ICD-10-CM | POA: Diagnosis not present

## 2023-09-25 DIAGNOSIS — R1013 Epigastric pain: Secondary | ICD-10-CM | POA: Insufficient documentation

## 2023-09-25 DIAGNOSIS — R531 Weakness: Secondary | ICD-10-CM | POA: Insufficient documentation

## 2023-09-25 DIAGNOSIS — W050XXA Fall from non-moving wheelchair, initial encounter: Secondary | ICD-10-CM | POA: Insufficient documentation

## 2023-09-25 DIAGNOSIS — F17210 Nicotine dependence, cigarettes, uncomplicated: Secondary | ICD-10-CM | POA: Insufficient documentation

## 2023-09-25 DIAGNOSIS — E876 Hypokalemia: Secondary | ICD-10-CM | POA: Diagnosis present

## 2023-09-25 LAB — CBC WITH DIFFERENTIAL/PLATELET
Abs Immature Granulocytes: 0.03 K/uL (ref 0.00–0.07)
Basophils Absolute: 0 K/uL (ref 0.0–0.1)
Basophils Relative: 1 %
Eosinophils Absolute: 0.1 K/uL (ref 0.0–0.5)
Eosinophils Relative: 2 %
HCT: 41 % (ref 36.0–46.0)
Hemoglobin: 13 g/dL (ref 12.0–15.0)
Immature Granulocytes: 1 %
Lymphocytes Relative: 32 %
Lymphs Abs: 1.7 K/uL (ref 0.7–4.0)
MCH: 27.6 pg (ref 26.0–34.0)
MCHC: 31.7 g/dL (ref 30.0–36.0)
MCV: 87 fL (ref 80.0–100.0)
Monocytes Absolute: 0.5 K/uL (ref 0.1–1.0)
Monocytes Relative: 10 %
Neutro Abs: 2.8 K/uL (ref 1.7–7.7)
Neutrophils Relative %: 54 %
Platelets: 203 K/uL (ref 150–400)
RBC: 4.71 MIL/uL (ref 3.87–5.11)
RDW: 17.2 % — ABNORMAL HIGH (ref 11.5–15.5)
WBC: 5.2 K/uL (ref 4.0–10.5)
nRBC: 0 % (ref 0.0–0.2)

## 2023-09-25 LAB — COMPREHENSIVE METABOLIC PANEL WITH GFR
ALT: 8 U/L (ref 0–44)
AST: 16 U/L (ref 15–41)
Albumin: 3.1 g/dL — ABNORMAL LOW (ref 3.5–5.0)
Alkaline Phosphatase: 47 U/L (ref 38–126)
Anion gap: 7 (ref 5–15)
BUN: 13 mg/dL (ref 8–23)
CO2: 24 mmol/L (ref 22–32)
Calcium: 8.1 mg/dL — ABNORMAL LOW (ref 8.9–10.3)
Chloride: 108 mmol/L (ref 98–111)
Creatinine, Ser: 1.07 mg/dL — ABNORMAL HIGH (ref 0.44–1.00)
GFR, Estimated: 57 mL/min — ABNORMAL LOW (ref >60)
Glucose, Bld: 78 mg/dL (ref 70–99)
Potassium: 3 mmol/L — ABNORMAL LOW (ref 3.5–5.1)
Sodium: 139 mmol/L (ref 135–145)
Total Bilirubin: 1.1 mg/dL (ref 0.0–1.2)
Total Protein: 6.8 g/dL (ref 6.5–8.1)

## 2023-09-25 LAB — LIPASE, BLOOD: Lipase: 33 U/L (ref 11–51)

## 2023-09-25 MED ORDER — HYDROCODONE-ACETAMINOPHEN 5-325 MG PO TABS
1.0000 | ORAL_TABLET | Freq: Once | ORAL | Status: AC
  Administered 2023-09-25: 1 via ORAL
  Filled 2023-09-25: qty 1

## 2023-09-25 MED ORDER — SODIUM CHLORIDE 0.9 % IV BOLUS
1000.0000 mL | Freq: Once | INTRAVENOUS | Status: AC
  Administered 2023-09-25: 1000 mL via INTRAVENOUS

## 2023-09-25 MED ORDER — POTASSIUM CHLORIDE CRYS ER 20 MEQ PO TBCR
40.0000 meq | EXTENDED_RELEASE_TABLET | Freq: Once | ORAL | Status: AC
  Administered 2023-09-25: 40 meq via ORAL
  Filled 2023-09-25: qty 4

## 2023-09-25 MED ORDER — POLYETHYLENE GLYCOL 3350 17 G PO PACK
17.0000 g | PACK | Freq: Every day | ORAL | Status: DC
  Administered 2023-09-25 – 2023-09-28 (×4): 17 g via ORAL
  Filled 2023-09-25 (×4): qty 1

## 2023-09-25 MED ORDER — NICOTINE 21 MG/24HR TD PT24
21.0000 mg | MEDICATED_PATCH | Freq: Every day | TRANSDERMAL | Status: DC
  Administered 2023-09-25 – 2023-09-28 (×4): 21 mg via TRANSDERMAL
  Filled 2023-09-25 (×4): qty 1

## 2023-09-25 MED ORDER — IOHEXOL 300 MG/ML  SOLN
100.0000 mL | Freq: Once | INTRAMUSCULAR | Status: AC | PRN
  Administered 2023-09-25: 100 mL via INTRAVENOUS

## 2023-09-25 MED ORDER — ONDANSETRON HCL 4 MG PO TABS: 4.0000 mg | ORAL_TABLET | Freq: Three times a day (TID) | ORAL | Status: DC | PRN

## 2023-09-25 NOTE — ED Triage Notes (Signed)
 pT C/O generalized weakness along left side arm and leg. Pt had a mechanical fall Thursday and was evaluated at [facility] [place] and cleared. Pt experiencing dizziness and nausea. No blood thinners. A&O x3.

## 2023-09-25 NOTE — ED Provider Notes (Signed)
 [place] [facility]GENC[facility] DEPARTMENT AT [facility] Provider Note  CSN: [id number] Arrival date & time: 09/25/23 1041  Chief Complaint(s) Extremity Weakness  HPI Rachael Lester is a 68 y.o. female with past medical history as below, significant for arthritis, hypertension, SCLC who presents to the ED with complaint of frequent falls, fall this morning  Patient was seen 7/10 following a fall.  She was offered for SNF placement at time but declined, she was sent home with wheelchair and home health.  Patient had a fall out of her wheelchair this morning when tempted go the bathroom.  Fell onto her left side and is having ongoing pain.  Seems to have reaggravated injuries that she sustained from recent visit.  She has no headache, no nausea or vomiting, no fevers or chills.  No chest pain or dyspnea.  She is not sure if she hit her head not when she fell  Past Medical History Past Medical History:  Diagnosis Date   Arthritis    Cancer (HCC)    Hypertension    Patient Active Problem List   Diagnosis Date Noted   SCLC (small cell lung carcinoma), right (HCC) 10/21/2016   Home Medication(s) Prior to Admission medications   Medication Sig Start Date End Date Taking? Authorizing Provider  ALPRAZolam  (XANAX ) 0.25 MG tablet TAKE 1 TABLET BY MOUTH TWICE A DAY IF NEEDED FOR ANXIETY 06/13/17   Timmy Maude SAUNDERS, MD  aspirin  EC 81 MG tablet Take 81 mg by mouth daily.    [provider]  atorvastatin (LIPITOR) 20 MG tablet Take 20 mg by mouth at bedtime. 08/19/17   [provider]  carvedilol  (COREG ) 3.125 MG tablet TAKE 1 TABLET BY MOUTH TWICE A DAY WITH MEALS 06/13/17   Ennever, Maude SAUNDERS, MD  oxyCODONE  (OXY IR/ROXICODONE ) 5 MG immediate release tablet TAKE 1 TABLET BY MOUTH EVERY 6 HOURS AS NEEDED FOR SEVERE PAIN 06/16/18   Timmy Maude SAUNDERS, MD  pantoprazole  (PROTONIX ) 40 MG tablet Take 1 tablet (40 mg total) by mouth daily. 09/20/16   Timmy Maude SAUNDERS, MD                                                                                                                                     Past Surgical History Past Surgical History:  Procedure Laterality Date   ABDOMINAL HYSTERECTOMY     Family History History reviewed. No pertinent family history.  Social History Social History   Tobacco Use   Smoking status: Every Day    Current packs/day: 0.50    Types: Cigarettes   Smokeless tobacco: Never  Vaping Use   Vaping status: Never Used  Substance Use Topics   Alcohol use: Yes    Comment: occasional   Allergies Delsym [dextromethorphan polistirex er]  Review of Systems A thorough review of systems was obtained and all systems are negative except as noted in the HPI and PMH.  Physical Exam Vital Signs  I have reviewed the triage vital signs BP (!) 135/95   Pulse 85   Temp 98.7 F (37.1 C) (Oral)   Resp 18   Ht 5' 6 (1.676 m)   Wt 72.6 kg   SpO2 97%   BMI 25.82 kg/m  Physical Exam Vitals and nursing note reviewed.  Constitutional:      General: She is not in acute distress.    Appearance: Normal appearance.  HENT:     Head: Normocephalic and atraumatic.     Right Ear: External ear normal.     Left Ear: External ear normal.     Nose: Nose normal.     Mouth/Throat:     Mouth: Mucous membranes are moist.  Eyes:     General: No scleral icterus.       Right eye: No discharge.        Left eye: No discharge.     Extraocular Movements: Extraocular movements intact.     Pupils: Pupils are equal, round, and reactive to light.  Cardiovascular:     Rate and Rhythm: Normal rate and regular rhythm.     Pulses: Normal pulses.     Heart sounds: Normal heart sounds.  Pulmonary:     Effort: Pulmonary effort is normal. No respiratory distress.     Breath sounds: Normal breath sounds. No stridor.  Abdominal:     General: Abdomen is flat. There is no distension.     Palpations: Abdomen is soft.     Tenderness: There is no abdominal tenderness.   Musculoskeletal:     Cervical back: No rigidity.     Right lower leg: No edema.     Left lower leg: No edema.  Skin:    General: Skin is warm and dry.     Capillary Refill: Capillary refill takes less than 2 seconds.  Neurological:     Mental Status: She is alert and oriented to person, place, and time.     GCS: GCS eye subscore is 4. GCS verbal subscore is 5. GCS motor subscore is 6.     Cranial Nerves: Cranial nerves 2-12 are intact.     Sensory: Sensation is intact.     Motor: Motor function is intact.     Coordination: Coordination is intact.     Comments: Gait testing deferred secondary to patient safety. Strength 5/5 to BLUE/BLLE, equal and symmetric   Psychiatric:        Mood and Affect: Mood normal.        Behavior: Behavior normal. Behavior is cooperative.     ED Results and Treatments Labs (all labs ordered are listed, but only abnormal results are displayed) Labs Reviewed  CBC WITH DIFFERENTIAL/PLATELET - Abnormal; Notable for the following components:      Result Value   RDW 17.2 (*)    All other components within normal limits  COMPREHENSIVE METABOLIC PANEL WITH GFR - Abnormal; Notable for the following components:   Potassium 3.0 (*)    Creatinine, Ser 1.07 (*)    Calcium 8.1 (*)    Albumin 3.1 (*)    GFR, Estimated 57 (*)    All other components within normal limits  LIPASE, BLOOD  Radiology CT ABDOMEN PELVIS W CONTRAST Result Date: 09/25/2023 CLINICAL DATA:  Epigastric pain. Mechanical fall. Dizziness and nausea. EXAM: CT ABDOMEN AND PELVIS WITH CONTRAST TECHNIQUE: Multidetector CT imaging of the abdomen and pelvis was performed using the standard protocol following bolus administration of intravenous contrast. RADIATION DOSE REDUCTION: This exam was performed according to the departmental dose-optimization program which includes automated  exposure control, adjustment of the mA and/or kV according to patient size and/or use of iterative reconstruction technique. CONTRAST:  OMNIPAQUE  IOHEXOL  300 MG/ML  SOLN COMPARISON:  04/20/2017. FINDINGS: Lower chest: The heart is normal in size and there is a stable small pericardial effusion. Mild atelectasis or scarring is noted at the lung bases. Hepatobiliary: A few scattered hypodensities are noted in the right lobe of the liver, unchanged from 2019 and likely representing cysts or hemangiomas. There is focal fatty infiltration in the left lobe of the liver. No biliary ductal dilatation is seen. The gallbladder is without stones. Pancreas: Unremarkable. No pancreatic ductal dilatation or surrounding inflammatory changes. Spleen: Normal in size without focal abnormality. There is trace perisplenic free fluid. Adrenals/Urinary Tract: The adrenal glands are within normal limits. The kidneys enhance symmetrically. No renal calculus or hydronephrosis bilaterally. The bladder is decompressed. Stomach/Bowel: The stomach is nondistended. No bowel obstruction, free air, or pneumatosis is seen. A large amount of stool is noted in the rectum. Appendix appears normal. Vascular/Lymphatic: Aortic atherosclerosis without evidence of aneurysm. No abdominal or pelvic lymphadenopathy. Reproductive: Status post hysterectomy. No adnexal masses. Other: A small amount of free fluid is noted in the pelvis on the right. Musculoskeletal: Degenerative changes are present in the thoracolumbar spine. IMPRESSION: 1. No acute intra-abdominal process. 2. Large amount of retained stool in the rectum. 3. Trace amount of perisplenic free fluid and small amount of free fluid in the pelvis on the right. 4. Aortic atherosclerosis. Electronically Signed   By: Leita Birmingham M.D.   On: 09/25/2023 14:23   CT Head Wo Contrast Result Date: 09/25/2023 CLINICAL DATA:  Rachael Lester.  Hit head. EXAM: CT HEAD WITHOUT CONTRAST CT CERVICAL SPINE WITHOUT  CONTRAST TECHNIQUE: Multidetector CT imaging of the head and cervical spine was performed following the standard protocol without intravenous contrast. Multiplanar CT image reconstructions of the cervical spine were also generated. RADIATION DOSE REDUCTION: This exam was performed according to the departmental dose-optimization program which includes automated exposure control, adjustment of the mA and/or kV according to patient size and/or use of iterative reconstruction technique. COMPARISON:  09/22/2023 FINDINGS: CT HEAD FINDINGS Brain: Stable significant age advanced periventricular white matter disease. Remote lacunar type basal ganglia infarcts. No CT findings for acute hemispheric infarction or intracranial hemorrhage. No extra-axial fluid collections are identified. The brainstem and cerebellum are grossly normal. Vascular: Age advanced vascular calcifications but no aneurysm or hyperdense vessels. Skull: No skull fracture or bone lesions. Sinuses/Orbits: The paranasal sinuses mastoid air cells are grossly clear. The globes are intact. Other: No scalp lesion or scalp hematoma. CT CERVICAL SPINE FINDINGS Alignment: Normal Skull base and vertebrae: No acute fracture. No primary bone lesion or focal pathologic process. Soft tissues and spinal canal: No prevertebral fluid or swelling. No visible canal hematoma. Disc levels: The spinal canal is quite generous. No spinal or foraminal stenosis. Upper chest: The lung apices demonstrate severe emphysematous changes and pulmonary scarring. Other: No neck mass or adenopathy. Age advanced carotid artery calcifications. IMPRESSION: 1. Stable significant age advanced periventricular white matter disease and remote lacunar type basal ganglia infarcts. 2. No acute  intracranial findings or skull fracture. 3. Normal alignment of the cervical spine without acute fracture. 4. Age advanced carotid artery calcifications. 5. Emphysema. Emphysema (ICD10-J43.9). Electronically  Signed   By: MYRTIS Stammer M.D.   On: 09/25/2023 14:18   CT Cervical Spine Wo Contrast Result Date: 09/25/2023 CLINICAL DATA:  Rachael Lester.  Hit head. EXAM: CT HEAD WITHOUT CONTRAST CT CERVICAL SPINE WITHOUT CONTRAST TECHNIQUE: Multidetector CT imaging of the head and cervical spine was performed following the standard protocol without intravenous contrast. Multiplanar CT image reconstructions of the cervical spine were also generated. RADIATION DOSE REDUCTION: This exam was performed according to the departmental dose-optimization program which includes automated exposure control, adjustment of the mA and/or kV according to patient size and/or use of iterative reconstruction technique. COMPARISON:  09/22/2023 FINDINGS: CT HEAD FINDINGS Brain: Stable significant age advanced periventricular white matter disease. Remote lacunar type basal ganglia infarcts. No CT findings for acute hemispheric infarction or intracranial hemorrhage. No extra-axial fluid collections are identified. The brainstem and cerebellum are grossly normal. Vascular: Age advanced vascular calcifications but no aneurysm or hyperdense vessels. Skull: No skull fracture or bone lesions. Sinuses/Orbits: The paranasal sinuses mastoid air cells are grossly clear. The globes are intact. Other: No scalp lesion or scalp hematoma. CT CERVICAL SPINE FINDINGS Alignment: Normal Skull base and vertebrae: No acute fracture. No primary bone lesion or focal pathologic process. Soft tissues and spinal canal: No prevertebral fluid or swelling. No visible canal hematoma. Disc levels: The spinal canal is quite generous. No spinal or foraminal stenosis. Upper chest: The lung apices demonstrate severe emphysematous changes and pulmonary scarring. Other: No neck mass or adenopathy. Age advanced carotid artery calcifications. IMPRESSION: 1. Stable significant age advanced periventricular white matter disease and remote lacunar type basal ganglia infarcts. 2. No acute  intracranial findings or skull fracture. 3. Normal alignment of the cervical spine without acute fracture. 4. Age advanced carotid artery calcifications. 5. Emphysema. Emphysema (ICD10-J43.9). Electronically Signed   By: MYRTIS Stammer M.D.   On: 09/25/2023 14:18   DG Chest Portable 1 View Result Date: 09/25/2023 CLINICAL DATA:  Weakness. EXAM: PORTABLE CHEST 1 VIEW COMPARISON:  September 22, 2023. FINDINGS: Stable cardiac silhouette. Right subclavian Port-A-Cath is unchanged. Left lung is clear. Stable dense consolidation is noted in right hilar and paratracheal region which may represent radiation fibrosis as described on prior CT scan. Minimal right basilar scarring is noted. Bony thorax is unremarkable. IMPRESSION: Stable right lung findings as noted above. No definite acute abnormality seen. Electronically Signed   By: Lynwood Landy Raddle M.D.   On: 09/25/2023 12:11    Pertinent labs & imaging results that were available during my care of the patient were reviewed by me and considered in my medical decision making (see MDM for details).  Medications Ordered in ED Medications  potassium chloride  SA (KLOR-CON  M) CR tablet 40 mEq (has no administration in time range)  HYDROcodone -acetaminophen  (NORCO/VICODIN) 5-325 MG per tablet 1 tablet (has no administration in time range)  iohexol  (OMNIPAQUE ) 300 MG/ML solution 100 mL (100 mLs Intravenous Contrast Given 09/25/23 1337)  sodium chloride  0.9 % bolus 1,000 mL (1,000 mLs Intravenous New Bag/Given 09/25/23 1358)  HYDROcodone -acetaminophen  (NORCO/VICODIN) 5-325 MG per tablet 1 tablet (1 tablet Oral Given 09/25/23 1355)  Procedures Procedures  (including critical care time)  Medical Decision Making / ED Course    Medical Decision Making:    Rachael Lester is a 68 y.o. female with past medical history as below,  significant for arthritis, hypertension, SCLC who presents to the ED with complaint of frequent falls, fall this morning. The complaint involves an extensive differential diagnosis and also carries with it a high risk of complications and morbidity.  Serious etiology was considered. Ddx includes but is not limited to: Differential diagnoses for head trauma includes subdural hematoma, epidural hematoma, acute concussion, traumatic subarachnoid hemorrhage, cerebral contusions, thoracic Abdominal traumatic injury, etc.   Complete initial physical exam performed, notably the patient was in no acute distress, HDS.    Reviewed and confirmed nursing documentation for past medical history, family history, social history.  Vital signs reviewed.    Frequent falls > - Generalized weakness, frequent falls, second ER visit in 3 days for fall. -She fell out of her wheelchair when trying to go to the toilet -Imaging stable today. -Pain improving. -Patient unsafe ambulation at home despite wheelchair and home health.  Recommend patient for SNF for frequent falls, unsteady ambulation, she is agreeable.  Hypokalemia> -replaced    Pt pending TOC eval for placement                  Additional history obtained: -Additional history obtained from na -External records from outside source obtained and reviewed including: Chart review including previous notes, labs, imaging, consultation notes including  Recent ed eval Prior labs/imaging   Lab Tests: -I ordered, reviewed, and interpreted labs.   The pertinent results include:   Labs Reviewed  CBC WITH DIFFERENTIAL/PLATELET - Abnormal; Notable for the following components:      Result Value   RDW 17.2 (*)    All other components within normal limits  COMPREHENSIVE METABOLIC PANEL WITH GFR - Abnormal; Notable for the following components:   Potassium 3.0 (*)    Creatinine, Ser 1.07 (*)    Calcium 8.1 (*)    Albumin 3.1 (*)    GFR,  Estimated 57 (*)    All other components within normal limits  LIPASE, BLOOD    Notable for hypok  EKG   EKG Interpretation Date/Time:    Ventricular Rate:    PR Interval:    QRS Duration:    QT Interval:    QTC Calculation:   R Axis:      Text Interpretation:           Imaging Studies ordered: I ordered imaging studies including cth, ctap, ct cervical, cxr I independently visualized the following imaging with scope of interpretation limited to determining acute life threatening conditions related to emergency care; findings noted above I agree with the radiologist interpretation If any imaging was obtained with contrast I closely monitored patient for any possible adverse reaction a/w contrast administration in the emergency department   Medicines ordered and prescription drug management: Meds ordered this encounter  Medications   iohexol  (OMNIPAQUE ) 300 MG/ML solution 100 mL   sodium chloride  0.9 % bolus 1,000 mL   HYDROcodone -acetaminophen  (NORCO/VICODIN) 5-325 MG per tablet 1 tablet    Refill:  0   potassium chloride  SA (KLOR-CON  M) CR tablet 40 mEq   HYDROcodone -acetaminophen  (NORCO/VICODIN) 5-325 MG per tablet 1 tablet    Refill:  0    -I have reviewed the patients home medicines and have made adjustments as needed   Consultations Obtained: I requested consultation with  the toc  Cardiac Monitoring: The patient was maintained on a cardiac monitor.  I personally viewed and interpreted the cardiac monitored which showed an underlying rhythm of: nsr Continuous pulse oximetry interpreted by myself, 100% on ra.    Social Determinants of Health:  Diagnosis or treatment significantly limited by social determinants of health: current smoker   Reevaluation: After the interventions noted above, I reevaluated the patient and found that they have improved  Co morbidities that complicate the patient evaluation  Past Medical History:  Diagnosis Date   Arthritis     Cancer (HCC)    Hypertension       Dispostion: Disposition decision including need for hospitalization was considered, and patient disposition pending at time of sign out.    Final Clinical Impression(s) / ED Diagnoses Final diagnoses:  Hypokalemia  Unsteady gait when walking  Frequent falls        Elnor Jayson LABOR, DO 09/25/23 1618

## 2023-09-26 DIAGNOSIS — E876 Hypokalemia: Secondary | ICD-10-CM | POA: Diagnosis not present

## 2023-09-26 MED ORDER — ACETAMINOPHEN 500 MG PO TABS
1000.0000 mg | ORAL_TABLET | Freq: Four times a day (QID) | ORAL | Status: DC | PRN
  Administered 2023-09-26 – 2023-09-27 (×3): 1000 mg via ORAL
  Filled 2023-09-26 (×3): qty 2

## 2023-09-26 NOTE — Evaluation (Signed)
 Physical Therapy Evaluation Patient Details Name: Rachael Lester MRN: [id number] DOB: [birth year] Today's Date: 09/26/2023  History of Present Illness  68 yo female brought to ED 09/25/23 after a fall from her WC, was seen inn ED  09/22/23 after a fall, declined SNF, returned home with a WC. SABRA Negative for pelvic fractures from first ED visit. PMH: HTN and small cell lung cancer  Clinical Impression  Pt admitted with above diagnosis.  Pt currently with functional limitations due to the deficits listed below (see PT Problem List). Pt will benefit from acute skilled PT to increase their independence and safety with mobility to allow discharge.     The patient reports that she cannot lift her LLE. Patient did assist when therapist lifted legs to bed edge and back onto bed. Patient able to stand and step to [facility] and back to stretcher, antalgic on the  LLE.  Patient reports  lives independently in [place], was here visiting daughter when fall occurred.  Patient will benefit from continued inpatient follow up therapy, <3 hours/day.       If plan is discharge home, recommend the following: Two people to help with walking and/or transfers;A little help with bathing/dressing/bathroom;Assistance with cooking/housework;Assist for transportation;Help with stairs or ramp for entrance   Can travel by private vehicle   Yes    Equipment Recommendations None recommended by PT  Recommendations for Other Services       Functional Status Assessment Patient has had a recent decline in their functional status and demonstrates the ability to make significant improvements in function in a reasonable and predictable amount of time.     Precautions / Restrictions Precautions Precautions: Fall Recall of Precautions/Restrictions: Impaired Restrictions Weight Bearing Restrictions Per Provider Order: No      Mobility  Bed Mobility Overal bed mobility: Needs Assistance Bed Mobility: Supine to Sit, Sit to  Supine     Supine to sit: Mod assist Sit to supine: Mod assist   General bed mobility comments: Pt states, I can't move my left  leg, Patient did assist when therapist lifted the leg to move to bed edge, assisted legs onto bed with min assistance    Transfers Overall transfer level: Needs assistance Equipment used: Rolling walker (2 wheels) Transfers: Sit to/from Stand, Bed to chair/wheelchair/BSC Sit to Stand: Min assist, Mod assist   Step pivot transfers: Min assist       General transfer comment: min assist to stand from higher surface, mod assistance from lower BSC, Able  to step to transfer using a RW and min assistance, antalgic on the LLE.    Ambulation/Gait                  Stairs            Wheelchair Mobility     Tilt Bed    Modified Rankin (Stroke Patients Only)       Balance Overall balance assessment: Needs assistance, History of Falls Sitting-balance support: No upper extremity supported, Feet supported Sitting balance-Leahy Scale: Fair     Standing balance support: During functional activity, Bilateral upper extremity supported, Reliant on assistive device for balance Standing balance-Leahy Scale: Poor Standing balance comment: decreased WB on LLE                             Pertinent Vitals/Pain Pain Assessment Pain Assessment: 0-10 Pain Score: 10-Worst pain ever Pain Location: left leg Pain Descriptors / Indicators:  Aching, Discomfort, Grimacing Pain Intervention(s): Limited activity within patient's tolerance, Monitored during session    Home Living Family/patient expects to be discharged to:: Private residence Living Arrangements: Alone   Type of Home: Apartment Home Access: Stairs to enter   [employer] of Steps: 15 +15 at daughters apt, her apt has 15 Alternate Level Stairs-Number of Steps: dtrs has 1 level, hers in [place] has 2 Home Layout: One level;Two level Home Equipment: Wheelchair -  manual Additional Comments: patient visiting  daughter when she fell    Prior Function Prior Level of Function : Needs assist       Physical Assist : Mobility (physical)     Mobility Comments: was using WC since  DC from ED 7/11       Extremity/Trunk Assessment   Upper Extremity Assessment Upper Extremity Assessment: Overall WFL for tasks assessed    Lower Extremity Assessment Lower Extremity Assessment: LLE deficits/detail LLE Deficits / Details: decreased kne felxion when supine but flexes WFL whe sitting on BSC    Cervical / Trunk Assessment Cervical / Trunk Assessment: Normal  Communication   Communication Communication: No apparent difficulties    Cognition Arousal: Alert Behavior During Therapy: WFL for tasks assessed/performed   PT - Cognitive impairments: No family/caregiver present to determine baseline, Orientation, Awareness, Memory   Orientation impairments: Time                   PT - Cognition Comments: reports  I can't remember what I take, I know i take  something for BP. Following commands: Intact       Cueing Cueing Techniques: Tactile cues     General Comments      Exercises     Assessment/Plan    PT Assessment Patient needs continued PT services  PT Problem List Decreased strength;Decreased range of motion;Decreased cognition;Decreased activity tolerance;Decreased balance;Decreased safety awareness;Decreased mobility       PT Treatment Interventions DME instruction;Therapeutic exercise;Gait training;Functional mobility training;Therapeutic activities;Patient/family education    PT Goals (Current goals can be found in the Care Plan section)  Acute Rehab PT Goals Patient Stated Goal: go to rehab to walk again PT Goal Formulation: With patient Time For Goal Achievement: 10/10/23 Potential to Achieve Goals: Good    Frequency Min 2X/week     Co-evaluation               AM-PAC PT 6 Clicks Mobility  Outcome  Measure Help needed turning from your back to your side while in a flat bed without using bedrails?: A Little Help needed moving from lying on your back to sitting on the side of a flat bed without using bedrails?: A Lot Help needed moving to and from a bed to a chair (including a wheelchair)?: A Lot Help needed standing up from a chair using your arms (e.g., wheelchair or bedside chair)?: A Lot Help needed to walk in hospital room?: Total Help needed climbing 3-5 steps with a railing? : Total 6 Click Score: 11    End of Session Equipment Utilized During Treatment: Gait belt Activity Tolerance: Patient limited by pain Patient left: in bed;with call bell/phone within reach Nurse Communication: Mobility status PT Visit Diagnosis: Unsteadiness on feet (R26.81)    Time: 8782-8759 PT Time Calculation (min) (ACUTE ONLY): 23 min   Charges:   PT Evaluation $PT Eval Low Complexity: 1 Low PT Treatments $Therapeutic Activity: 8-22 mins PT General Charges $$ ACUTE PT VISIT: 1 Visit  Darice Potters PT Acute Rehabilitation Services Office [phone]   Potters Darice Norris 09/26/2023, 1:20 PM

## 2023-09-26 NOTE — Progress Notes (Addendum)
 Auth pending for [facility].   Addend @ 2:51PM Requested documentation uploaded into [employer]. Pasrr pending.

## 2023-09-26 NOTE — ED Notes (Signed)
 Pt assisted back to the bed. Partial linen change was done, and pt had c/o a mild headache and described it as burning int the back of her head. Pt provided w/blankets and an icee x2. No other needs identified at this time. Will continue to monitor.

## 2023-09-26 NOTE — [place] FL2 (Signed)
 [place]  MEDICAID [id number] LEVEL OF CARE FORM     IDENTIFICATION  Patient Name: Rachael Lester Birthdate: 07-Mar-1956 Sex: female Admission Date (Current Location): 09/25/2023  [facility] and [place] Number:  Producer, television/film/video and Address:  [facility],  [address] [place], [place] [address]      Provider Number: [id number]  Attending Physician Name and Address:  [name], MD  Relative Name and Phone Number:  Nedra, Mcinnis (Daughter)  [phone] [facility])    Current Level of Care: Hospital Recommended Level of Care: Skilled Nursing Facility Prior Approval Number:    Date Approved/Denied:   PASRR Number: pending - screen still running due to out of state address  Discharge Plan: SNF    Current Diagnoses: Patient Active Problem List   Diagnosis Date Noted   SCLC (small cell lung carcinoma), right (HCC) 10/21/2016    Orientation RESPIRATION BLADDER Height & Weight     Self, Place  Normal Continent Weight: 160 lb (72.6 kg) Height:  5' 6 (167.6 cm)  BEHAVIORAL SYMPTOMS/MOOD NEUROLOGICAL BOWEL NUTRITION STATUS      Continent Diet (Regular)  AMBULATORY STATUS COMMUNICATION OF NEEDS Skin   Limited Assist Verbally Normal                       Personal Care Assistance Level of Assistance  Bathing, Feeding, Dressing Bathing Assistance: Limited assistance Feeding assistance: Independent Dressing Assistance: Limited assistance     Functional Limitations Info  Sight, Hearing, Speech Sight Info: Adequate Hearing Info: Adequate Speech Info: Adequate    SPECIAL CARE FACTORS FREQUENCY  PT (By licensed PT), OT (By licensed OT)     PT Frequency: x5/week OT Frequency: x5/week            Contractures Contractures Info: Not present    Additional Factors Info  Code Status, Allergies Code Status Info: Full Allergies Info: Delsym (Dextromethorphan Polistirex Er)           Current Medications (09/26/2023):  This is the current  hospital active medication list Current Facility-Administered Medications  Medication Dose Route Frequency Provider Last Rate Last Admin   acetaminophen  (TYLENOL ) tablet 1,000 mg  1,000 mg Oral Q6H PRN [name], MD   1,000 mg at 09/26/23 1054   nicotine  (NICODERM CQ  - dosed in mg/24 hours) patch 21 mg  21 mg Transdermal Daily Elnor Savant A, DO   21 mg at 09/26/23 1053   ondansetron  (ZOFRAN ) tablet 4 mg  4 mg Oral Q8H PRN Elnor Savant A, DO       polyethylene glycol (MIRALAX  / GLYCOLAX ) packet 17 g  17 g Oral Daily Elnor Savant A, DO   17 g at 09/26/23 1054   Current Outpatient Medications  Medication Sig Dispense Refill   ALPRAZolam  (XANAX ) 0.25 MG tablet TAKE 1 TABLET BY MOUTH TWICE A DAY IF NEEDED FOR ANXIETY 60 tablet 0   amLODipine  (NORVASC ) 5 MG tablet Take 5 mg by mouth daily.     aspirin  EC 81 MG tablet Take 81 mg by mouth daily.     pantoprazole  (PROTONIX ) 40 MG tablet Take 1 tablet (40 mg total) by mouth daily. (Patient not taking: Reported on 09/25/2023) 30 tablet 6   Facility-Administered Medications Ordered in Other Encounters  Medication Dose Route Frequency Provider Last Rate Last Admin   sodium chloride  flush (NS) 0.9 % injection 10 mL  10 mL Intravenous PRN Ennever, Peter R, MD   10 mL at 08/26/17 [phone]  Discharge Medications: Please see discharge summary for a list of discharge medications.  Relevant Imaging Results:  Relevant Lab Results:   Additional Information DDW:[id number]  Kari JONETTA Daisy, LCSW

## 2023-09-26 NOTE — ED Notes (Signed)
 Pt assisted to the [facility] w/o complaint or incident. Call bell within reach on the bed for her to use when she is finished.

## 2023-09-26 NOTE — Progress Notes (Signed)
 CSW spike with Rachael Lester to present bed offers. Rachael accepted [facility]. CSW notified [facility]. Will initiate auth.

## 2023-09-26 NOTE — Progress Notes (Signed)
Awaiting PT eval.  

## 2023-09-26 NOTE — ED Provider Notes (Signed)
 Emergency Medicine Observation Re-evaluation Note  Rachael Lester is a 68 y.o. female, seen on rounds today.  Pt initially presented to the ED for complaints of Extremity Weakness Currently, the patient is resting.  Physical Exam  BP (!) 135/95   Pulse 85   Temp 98.7 F (37.1 C) (Oral)   Resp 18   Ht 5' 6 (1.676 m)   Wt 72.6 kg   SpO2 97%   BMI 25.82 kg/m  Physical Exam General: NAD   ED Course / MDM  EKG:   I have reviewed the labs performed to date as well as medications administered while in observation.  Recent changes in the last 24 hours include no acute events reported.  Plan  [facility] plan [facility].    Rachael Maude BROCKS, MD 09/26/23 [phone]

## 2023-09-26 NOTE — Progress Notes (Signed)
 30 Day PASRR Note   Patient Details  Name: Rachael Lester Date of Birth: 1955/08/29   Transition of Care [facility]) CM/SW Contact:    Kari JONETTA Daisy, LCSW Phone Number: 09/26/2023, 2:19 PM  To Whom It May Concern:  Please be advised that this patient will require a short-term nursing home stay - anticipated 30 days or less for rehabilitation and strengthening.   The plan is for return home.

## 2023-09-27 ENCOUNTER — Emergency Department (HOSPITAL_COMMUNITY)

## 2023-09-27 DIAGNOSIS — E876 Hypokalemia: Secondary | ICD-10-CM | POA: Diagnosis not present

## 2023-09-27 MED ORDER — DIPHENHYDRAMINE HCL 50 MG/ML IJ SOLN
12.5000 mg | Freq: Once | INTRAMUSCULAR | Status: AC
  Administered 2023-09-27: 12.5 mg via INTRAVENOUS
  Filled 2023-09-27: qty 1

## 2023-09-27 MED ORDER — METOCLOPRAMIDE HCL 10 MG PO TABS: 10.0000 mg | ORAL_TABLET | Freq: Once | ORAL | Status: DC

## 2023-09-27 MED ORDER — METOCLOPRAMIDE HCL 5 MG/ML IJ SOLN
10.0000 mg | Freq: Once | INTRAMUSCULAR | Status: AC
  Administered 2023-09-27: 10 mg via INTRAVENOUS
  Filled 2023-09-27: qty 2

## 2023-09-27 MED ORDER — DIPHENHYDRAMINE HCL 25 MG PO CAPS: 25.0000 mg | ORAL_CAPSULE | Freq: Once | ORAL | Status: DC

## 2023-09-27 NOTE — Progress Notes (Addendum)
 PASRR: [id number] A  Auth still pending as of 12:57PM.

## 2023-09-27 NOTE — ED Provider Notes (Signed)
 Emergency Medicine Observation Re-evaluation Note  Rachael Lester is a 68 y.o. female, seen on rounds today.  Pt initially presented to the ED for complaints of Extremity Weakness Currently, the patient is sleeping.  Physical Exam  BP 137/78   Pulse 65   Temp 98 F (36.7 C)   Resp 18   Ht 5' 6 (1.676 m)   Wt 72.6 kg   SpO2 96%   BMI 25.82 kg/m  Physical Exam General: Sleeping Cardiac: Extremities well-perfused Lungs: Breathing is unlabored Psych: Deferred  ED Course / MDM  EKG:   I have reviewed the labs performed to date as well as medications administered while in observation.  Recent changes in the last 24 hours include physical therapy evaluation.  Patient requires 2 person assist with walking and transfers.  Recommendations are for skilled physical therapy.  Social work attempting to arrange facility placement.  Plan  Current plan is for placement.    Melvenia Motto, MD 09/27/23 [phone]

## 2023-09-27 NOTE — Progress Notes (Signed)
 Rachael Lester is still pending

## 2023-09-27 NOTE — Progress Notes (Signed)
 Physical Therapy Treatment Patient Details Name: Rachael Lester MRN: [id number] DOB: [birth year] Today's Date: 09/27/2023   History of Present Illness 68 yo female brought to ED 09/25/23 after a fall from her WC, was seen inn ED  09/22/23 after a fall, declined SNF, returned home with a WC. SABRA Negative for pelvic fractures from first ED visit. PMH: HTN and small cell lung cancer    PT Comments  Pt seen in ED rm# 23 AxO x 3 pleasant and able to recall her falls (2 in one day) repeating my legs(LEFT) gave way.  Pt lives with her Daughter. Assisted with transfers and amb. General bed mobility comments: required increased assist with upper body with supine to sit then assist B LE up onto stretcher.  increased assist with LEFT LE. General transfer comment: VC's for proper hand placement as well as safety with turn completion.  Assisted on/off BSC.  Assisted with a wheelchair transfer.General Gait Details: very limited gait distance 8 feet with mod/max assist and very short steps.  Difficulty advancing L LE. Unsteady.  WC following behind for safety.  Pt also c/o light sensitivity along with a HA and dizziness with activity.  ? Mild concussion(brain bounce) post falls. Pt requesting pain meds.  Reported to RN. LPT has rec Pt will need ST Rehab at SNF to address mobility and functional decline prior to safely returning home.    If plan is discharge home, recommend the following: Two people to help with walking and/or transfers;A little help with bathing/dressing/bathroom;Assistance with cooking/housework;Assist for transportation;Help with stairs or ramp for entrance   Can travel by private vehicle     Yes  Equipment Recommendations  None recommended by PT    Recommendations for Other Services       Precautions / Restrictions Precautions Precautions: Fall Recall of Precautions/Restrictions: Intact Restrictions Weight Bearing Restrictions Per Provider Order: No     Mobility  Bed  Mobility Overal bed mobility: Needs Assistance Bed Mobility: Supine to Sit, Sit to Supine     Supine to sit: Mod assist Sit to supine: Mod assist   General bed mobility comments: required increased assist with upper body with supine to sit then assist B LE up onto stretcher.  increased assist with LEFT LE.    Transfers Overall transfer level: Needs assistance Equipment used: Rolling walker (2 wheels), None Transfers: Sit to/from Stand, Bed to chair/wheelchair/BSC Sit to Stand: Min assist, Mod assist           General transfer comment: VC's for proper hand placement as well as safety with turn completion.  Assisted on/off BSC.  Assisted with a wheelchair transfer.    Ambulation/Gait Ambulation/Gait assistance: Mod assist, Max assist Gait Distance (Feet): 8 Feet Assistive device: Rolling walker (2 wheels) Gait Pattern/deviations: Step-to pattern, Decreased stance time - left Gait velocity: decreased     General Gait Details: very limited gait distance 8 feet with mod/max assist and very short steps.  Difficulty advancing L LE. Unsteady.  increased c/o HA and mild dizziness with activity.  WC following behind for safety.   Stairs             Wheelchair Mobility     Tilt Bed    Modified Rankin (Stroke Patients Only)       Balance  Communication Communication Communication: No apparent difficulties  Cognition Arousal: Alert Behavior During Therapy: WFL for tasks assessed/performed   PT - Cognitive impairments: No apparent impairments                       PT - Cognition Comments: AxO x 3 pleasant and able to recall her falls (2 in one day) repeating my legs(LEFT) gave way.  Pt lives with her Daughter. Following commands: Intact      Cueing Cueing Techniques: Verbal cues  Exercises      General Comments        Pertinent Vitals/Pain Pain Assessment Pain Assessment:  Faces Pain Location: Headache (top frontal lobe) ever since I fell Pain Descriptors / Indicators: Aching, Discomfort, Dull Pain Intervention(s): Patient requesting pain meds-RN notified    Home Living                          Prior Function            PT Goals (current goals can now be found in the care plan section) Progress towards PT goals: Progressing toward goals    Frequency    Min 2X/week      PT Plan      Co-evaluation              AM-PAC PT 6 Clicks Mobility   Outcome Measure  Help needed turning from your back to your side while in a flat bed without using bedrails?: A Little Help needed moving from lying on your back to sitting on the side of a flat bed without using bedrails?: A Little Help needed moving to and from a bed to a chair (including a wheelchair)?: A Little Help needed standing up from a chair using your arms (e.g., wheelchair or bedside chair)?: A Little Help needed to walk in hospital room?: A Little Help needed climbing 3-5 steps with a railing? : A Lot 6 Click Score: 17    End of Session Equipment Utilized During Treatment: Gait belt Activity Tolerance: Patient limited by fatigue Patient left: in bed;with call bell/phone within reach Nurse Communication: Mobility status PT Visit Diagnosis: Unsteadiness on feet (R26.81)     Time: 8342-8277 PT Time Calculation (min) (ACUTE ONLY): 25 min  Charges:    $Gait Training: 8-22 mins $Therapeutic Activity: 8-22 mins PT General Charges $$ ACUTE PT VISIT: 1 Visit                     Katheryn Leap  PTA Acute  Rehabilitation Services Office M-F          [phone]

## 2023-09-27 NOTE — ED Notes (Signed)
 Pt noted to have urinated on herself so she was assisted to the chair and her bedding was changed along with her gown and socks. It was suggested she sit in the chair for a while and she agreed. Pt was sitting in the chair and breakfast was provided. Pt had no complaints and call bell was in reach.

## 2023-09-27 NOTE — ED Notes (Signed)
 Assisted pt to bedside commode. Pt complaining of sudden headache feeling like she's been hit on her head.

## 2023-09-28 DIAGNOSIS — E876 Hypokalemia: Secondary | ICD-10-CM | POA: Diagnosis not present

## 2023-09-28 MED ORDER — AMLODIPINE BESYLATE 5 MG PO TABS
5.0000 mg | ORAL_TABLET | Freq: Every day | ORAL | Status: DC
  Administered 2023-09-28: 5 mg via ORAL
  Filled 2023-09-28: qty 1

## 2023-09-28 MED ORDER — ASPIRIN 81 MG PO TBEC
81.0000 mg | DELAYED_RELEASE_TABLET | Freq: Every day | ORAL | Status: DC
  Administered 2023-09-28: 81 mg via ORAL
  Filled 2023-09-28: qty 1

## 2023-09-28 NOTE — ED Notes (Addendum)
 Informed pt her placement was accepted at [facility].

## 2023-09-28 NOTE — Progress Notes (Signed)
 Peer to Peer offered. Information provided to EDP via secure chat. Deadline is 12pm.   [phone], option 5 Have name, DOB, and member ID

## 2023-09-28 NOTE — ED Provider Notes (Signed)
 I discussed the patient's case in a peer to peer conversation with skilled nursing services.  Dispo pending   Garrick Charleston, MD 09/28/23 [phone]

## 2023-09-28 NOTE — ED Provider Notes (Signed)
 Emergency Medicine Observation Re-evaluation Note  Rachael Lester is a 68 y.o. female, seen on rounds today.  Pt initially presented to the ED for complaints of Extremity Weakness Currently, the patient is sitting upright eating breakfast.  Physical Exam  BP 125/79   Pulse 94   Temp 98.1 F (36.7 C) (Oral)   Resp 16   Ht 5' 6 (1.676 m)   Wt 72.6 kg   SpO2 98%   BMI 25.82 kg/m  Physical Exam General: Elderly female sitting upright in no distress Cardiac: Regular rate and rhythm Lungs: No increased work of breathing Psych: Calm  ED Course / MDM  EKG:EKG Interpretation Date/Time:  Wednesday September 28 2023 08:44:58 EDT Ventricular Rate:  71 PR Interval:  173 QRS Duration:  93 QT Interval:  410 QTC Calculation: 446 R Axis:   -12  Text Interpretation: Sinus rhythm LAE, consider biatrial enlargement Low voltage, precordial leads ST-t wave abnormality No significant change since last tracing Abnormal ECG Confirmed by Garrick Charleston [phone]) on 09/28/2023 8:47:21 AM  I have reviewed the labs performed to date as well as medications administered while in observation.  Recent changes in the last 24 hours include ongoing efforts for placement.  Plan  Current plan is for placement.    Garrick Charleston, MD 09/28/23 [phone]

## 2023-09-28 NOTE — ED Notes (Addendum)
 [facility] to provide report, was left on hold for 10 minutes, will attempt again later.

## 2023-09-28 NOTE — ED Notes (Signed)
 Went in pt was wet and had a pure wick on bed was wet ! Gave her a sponge bath changed linen and gown

## 2023-09-28 NOTE — Progress Notes (Addendum)
 Auth approved for a start date as of today. Pt will discharge to [facility]. [facility] to transport. Daughter notified.   Auth reference #: [id number]
